# Patient Record
Sex: Female | Born: 1940 | Race: Black or African American | Hispanic: No | Marital: Married | Smoking: Former smoker
Health system: Southern US, Community
[De-identification: ages and names within clinical notes are randomized; demographics above are authoritative.]

## PROBLEM LIST (undated history)

## (undated) DIAGNOSIS — Z972 Presence of dental prosthetic device (complete) (partial): Secondary | ICD-10-CM

## (undated) DIAGNOSIS — G500 Trigeminal neuralgia: Secondary | ICD-10-CM

## (undated) DIAGNOSIS — F329 Major depressive disorder, single episode, unspecified: Secondary | ICD-10-CM

## (undated) DIAGNOSIS — F419 Anxiety disorder, unspecified: Secondary | ICD-10-CM

## (undated) DIAGNOSIS — K219 Gastro-esophageal reflux disease without esophagitis: Secondary | ICD-10-CM

## (undated) DIAGNOSIS — R7303 Prediabetes: Secondary | ICD-10-CM

## (undated) DIAGNOSIS — E785 Hyperlipidemia, unspecified: Secondary | ICD-10-CM

## (undated) DIAGNOSIS — S065X9A Traumatic subdural hemorrhage with loss of consciousness of unspecified duration, initial encounter: Secondary | ICD-10-CM

## (undated) DIAGNOSIS — E041 Nontoxic single thyroid nodule: Secondary | ICD-10-CM

## (undated) DIAGNOSIS — M1990 Unspecified osteoarthritis, unspecified site: Secondary | ICD-10-CM

## (undated) DIAGNOSIS — F32A Depression, unspecified: Secondary | ICD-10-CM

## (undated) DIAGNOSIS — K7689 Other specified diseases of liver: Secondary | ICD-10-CM

## (undated) DIAGNOSIS — I10 Essential (primary) hypertension: Secondary | ICD-10-CM

## (undated) HISTORY — PX: CHOLECYSTECTOMY: SHX55

## (undated) HISTORY — PX: ABDOMINAL HYSTERECTOMY: SHX81

---

## 2002-04-08 DIAGNOSIS — S065X9A Traumatic subdural hemorrhage with loss of consciousness of unspecified duration, initial encounter: Secondary | ICD-10-CM

## 2002-04-08 DIAGNOSIS — S065XAA Traumatic subdural hemorrhage with loss of consciousness status unknown, initial encounter: Secondary | ICD-10-CM

## 2002-04-08 HISTORY — DX: Traumatic subdural hemorrhage with loss of consciousness status unknown, initial encounter: S06.5XAA

## 2002-04-08 HISTORY — DX: Traumatic subdural hemorrhage with loss of consciousness of unspecified duration, initial encounter: S06.5X9A

## 2017-01-09 ENCOUNTER — Observation Stay
Admission: EM | Admit: 2017-01-09 | Discharge: 2017-01-10 | Disposition: A | Discharge status: Home / Self Care | Payer: Medicare Other | Attending: Internal Medicine | Admitting: Internal Medicine

## 2017-01-09 ENCOUNTER — Emergency Department: Payer: Medicare Other

## 2017-01-09 ENCOUNTER — Encounter: Payer: Self-pay | Admitting: Intensive Care

## 2017-01-09 DIAGNOSIS — I10 Essential (primary) hypertension: Secondary | ICD-10-CM | POA: Diagnosis present

## 2017-01-09 DIAGNOSIS — E785 Hyperlipidemia, unspecified: Secondary | ICD-10-CM | POA: Insufficient documentation

## 2017-01-09 DIAGNOSIS — I951 Orthostatic hypotension: Secondary | ICD-10-CM | POA: Diagnosis present

## 2017-01-09 DIAGNOSIS — F419 Anxiety disorder, unspecified: Secondary | ICD-10-CM | POA: Insufficient documentation

## 2017-01-09 DIAGNOSIS — Z7982 Long term (current) use of aspirin: Secondary | ICD-10-CM | POA: Diagnosis not present

## 2017-01-09 DIAGNOSIS — R9389 Abnormal findings on diagnostic imaging of other specified body structures: Secondary | ICD-10-CM | POA: Diagnosis not present

## 2017-01-09 DIAGNOSIS — I959 Hypotension, unspecified: Secondary | ICD-10-CM | POA: Diagnosis not present

## 2017-01-09 DIAGNOSIS — Z87891 Personal history of nicotine dependence: Secondary | ICD-10-CM | POA: Insufficient documentation

## 2017-01-09 DIAGNOSIS — N289 Disorder of kidney and ureter, unspecified: Secondary | ICD-10-CM | POA: Diagnosis present

## 2017-01-09 DIAGNOSIS — R55 Syncope and collapse: Secondary | ICD-10-CM

## 2017-01-09 DIAGNOSIS — F329 Major depressive disorder, single episode, unspecified: Secondary | ICD-10-CM | POA: Insufficient documentation

## 2017-01-09 DIAGNOSIS — E860 Dehydration: Secondary | ICD-10-CM | POA: Insufficient documentation

## 2017-01-09 DIAGNOSIS — R932 Abnormal findings on diagnostic imaging of liver and biliary tract: Secondary | ICD-10-CM | POA: Diagnosis not present

## 2017-01-09 DIAGNOSIS — Z8249 Family history of ischemic heart disease and other diseases of the circulatory system: Secondary | ICD-10-CM | POA: Diagnosis not present

## 2017-01-09 DIAGNOSIS — G500 Trigeminal neuralgia: Secondary | ICD-10-CM | POA: Diagnosis present

## 2017-01-09 HISTORY — DX: Anxiety disorder, unspecified: F41.9

## 2017-01-09 HISTORY — DX: Essential (primary) hypertension: I10

## 2017-01-09 HISTORY — DX: Depression, unspecified: F32.A

## 2017-01-09 HISTORY — DX: Trigeminal neuralgia: G50.0

## 2017-01-09 HISTORY — DX: Hyperlipidemia, unspecified: E78.5

## 2017-01-09 HISTORY — DX: Major depressive disorder, single episode, unspecified: F32.9

## 2017-01-09 LAB — TROPONIN I

## 2017-01-09 LAB — URINALYSIS, COMPLETE (UACMP) WITH MICROSCOPIC
BACTERIA UA: NONE SEEN
Bilirubin Urine: NEGATIVE
Glucose, UA: NEGATIVE mg/dL
Hgb urine dipstick: NEGATIVE
Ketones, ur: NEGATIVE mg/dL
Leukocytes, UA: NEGATIVE
NITRITE: NEGATIVE
PROTEIN: NEGATIVE mg/dL
RBC / HPF: NONE SEEN RBC/hpf (ref 0–5)
SPECIFIC GRAVITY, URINE: 1.011 (ref 1.005–1.030)
pH: 6 (ref 5.0–8.0)

## 2017-01-09 LAB — CBC
HEMATOCRIT: 34 % — AB (ref 35.0–47.0)
HEMOGLOBIN: 11.4 g/dL — AB (ref 12.0–16.0)
MCH: 26.3 pg (ref 26.0–34.0)
MCHC: 33.5 g/dL (ref 32.0–36.0)
MCV: 78.5 fL — AB (ref 80.0–100.0)
Platelets: 260 10*3/uL (ref 150–440)
RBC: 4.33 MIL/uL (ref 3.80–5.20)
RDW: 14.8 % — AB (ref 11.5–14.5)
WBC: 6.7 10*3/uL (ref 3.6–11.0)

## 2017-01-09 LAB — BASIC METABOLIC PANEL
Anion gap: 9 (ref 5–15)
BUN: 15 mg/dL (ref 6–20)
CO2: 28 mmol/L (ref 22–32)
Calcium: 9.4 mg/dL (ref 8.9–10.3)
Chloride: 98 mmol/L — ABNORMAL LOW (ref 101–111)
Creatinine, Ser: 1.15 mg/dL — ABNORMAL HIGH (ref 0.44–1.00)
GFR calc Af Amer: 53 mL/min — ABNORMAL LOW (ref >60)
GFR, EST NON AFRICAN AMERICAN: 46 mL/min — AB (ref >60)
GLUCOSE: 149 mg/dL — AB (ref 65–99)
POTASSIUM: 4.4 mmol/L (ref 3.5–5.1)
Sodium: 135 mmol/L (ref 135–145)

## 2017-01-09 LAB — TSH: TSH: 0.98 u[IU]/mL (ref 0.350–4.500)

## 2017-01-09 LAB — FIBRIN DERIVATIVES D-DIMER (ARMC ONLY): FIBRIN DERIVATIVES D-DIMER (ARMC): 888.87 ng{FEU}/mL — AB (ref 0.00–499.00)

## 2017-01-09 LAB — GLUCOSE, CAPILLARY: GLUCOSE-CAPILLARY: 142 mg/dL — AB (ref 65–99)

## 2017-01-09 MED ORDER — PNEUMOCOCCAL VAC POLYVALENT 25 MCG/0.5ML IJ INJ: 0.5000 mL | INJECTION | INTRAMUSCULAR | Status: DC

## 2017-01-09 MED ORDER — ONDANSETRON HCL 4 MG/2ML IJ SOLN
4.0000 mg | Freq: Once | INTRAMUSCULAR | Status: AC
  Administered 2017-01-09: 4 mg via INTRAVENOUS
  Filled 2017-01-09: qty 2

## 2017-01-09 MED ORDER — SODIUM CHLORIDE 0.9 % IV BOLUS (SEPSIS)
1000.0000 mL | Freq: Once | INTRAVENOUS | Status: AC
  Administered 2017-01-09: 1000 mL via INTRAVENOUS

## 2017-01-09 MED ORDER — ATORVASTATIN CALCIUM 20 MG PO TABS
20.0000 mg | ORAL_TABLET | Freq: Every day | ORAL | Status: DC
  Administered 2017-01-10: 20 mg via ORAL
  Filled 2017-01-09: qty 1

## 2017-01-09 MED ORDER — CLONAZEPAM 0.5 MG PO TABS
0.2500 mg | ORAL_TABLET | Freq: Two times a day (BID) | ORAL | Status: DC
  Administered 2017-01-10 (×2): 0.25 mg via ORAL
  Filled 2017-01-09 (×2): qty 1

## 2017-01-09 MED ORDER — BIOTIN 5 MG PO TABS: 5.0000 mg | ORAL_TABLET | Freq: Two times a day (BID) | ORAL | Status: DC

## 2017-01-09 MED ORDER — INFLUENZA VAC SPLIT HIGH-DOSE 0.5 ML IM SUSY
0.5000 mL | PREFILLED_SYRINGE | INTRAMUSCULAR | Status: DC
  Filled 2017-01-09: qty 0.5

## 2017-01-09 MED ORDER — ASPIRIN EC 81 MG PO TBEC
162.0000 mg | DELAYED_RELEASE_TABLET | Freq: Every day | ORAL | Status: DC
  Administered 2017-01-10: 162 mg via ORAL
  Filled 2017-01-09: qty 2

## 2017-01-09 MED ORDER — GABAPENTIN 300 MG PO CAPS
600.0000 mg | ORAL_CAPSULE | Freq: Two times a day (BID) | ORAL | Status: DC
  Administered 2017-01-10 (×2): 600 mg via ORAL
  Filled 2017-01-09 (×2): qty 2

## 2017-01-09 MED ORDER — BUPROPION HCL ER (XL) 150 MG PO TB24
300.0000 mg | ORAL_TABLET | Freq: Every day | ORAL | Status: DC
  Administered 2017-01-10: 300 mg via ORAL
  Filled 2017-01-09: qty 2

## 2017-01-09 MED ORDER — ENOXAPARIN SODIUM 40 MG/0.4ML ~~LOC~~ SOLN: 40.0000 mg | SUBCUTANEOUS | Status: DC

## 2017-01-09 MED ORDER — ACETAMINOPHEN 650 MG RE SUPP: 650.0000 mg | Freq: Four times a day (QID) | RECTAL | Status: DC | PRN

## 2017-01-09 MED ORDER — ACETAMINOPHEN 325 MG PO TABS: 650.0000 mg | ORAL_TABLET | Freq: Four times a day (QID) | ORAL | Status: DC | PRN

## 2017-01-09 MED ORDER — IOPAMIDOL (ISOVUE-370) INJECTION 76%
75.0000 mL | Freq: Once | INTRAVENOUS | Status: AC | PRN
  Administered 2017-01-09: 75 mL via INTRAVENOUS

## 2017-01-09 MED ORDER — VENLAFAXINE HCL ER 75 MG PO CP24
150.0000 mg | ORAL_CAPSULE | Freq: Every day | ORAL | Status: DC
  Administered 2017-01-10: 150 mg via ORAL
  Filled 2017-01-09: qty 2

## 2017-01-09 MED ORDER — ONDANSETRON HCL 4 MG PO TABS: 4.0000 mg | ORAL_TABLET | Freq: Four times a day (QID) | ORAL | Status: DC | PRN

## 2017-01-09 MED ORDER — PANTOPRAZOLE SODIUM 40 MG PO TBEC
40.0000 mg | DELAYED_RELEASE_TABLET | Freq: Every day | ORAL | Status: DC
  Administered 2017-01-10: 40 mg via ORAL
  Filled 2017-01-09: qty 1

## 2017-01-09 MED ORDER — CARBAMAZEPINE ER 200 MG PO TB12
200.0000 mg | ORAL_TABLET | Freq: Two times a day (BID) | ORAL | Status: DC
  Administered 2017-01-10: 200 mg via ORAL
  Filled 2017-01-09 (×3): qty 1

## 2017-01-09 MED ORDER — ONDANSETRON HCL 4 MG/2ML IJ SOLN: 4.0000 mg | Freq: Four times a day (QID) | INTRAMUSCULAR | Status: DC | PRN

## 2017-01-09 NOTE — Progress Notes (Signed)
PHARMACIST - PHYSICIAN ORDER COMMUNICATION  CONCERNING: P&T Medication Policy on Herbal Medications  DESCRIPTION:  This patient's order for:  biotin  has been noted.  This product(s) is classified as an "herbal" or natural product. Due to a lack of definitive safety studies or FDA approval, nonstandard manufacturing practices, plus the potential risk of unknown drug-drug interactions while on inpatient medications, the Pharmacy and Therapeutics Committee does not permit the use of "herbal" or natural products of this type within [place].   ACTION TAKEN: The pharmacy department is unable to verify this order at this time Please reevaluate patient's clinical condition at discharge and address if the herbal or natural product(s) should be resumed at that time.   

## 2017-01-09 NOTE — ED Provider Notes (Signed)
[facility] Emergency Department Provider Note  ____________________________________________  Time seen: Approximately 5:20 PM  I have reviewed the triage vital signs and the nursing notes.   HISTORY  Chief Complaint Loss of Consciousness    HPI Lynnett Moffitt is a 76 y.o. female with a history of hypertension, hyperlipidemia, presenting with syncope 4. The patient reports that she had 3 "blacking out spells" yesterday.She describes that she develops a "funny feeling" in the frontal part of her head, then had severe lightheadedness with nausea, and had brief syncopal episodes w/o any injury, tonic clonic activity, urinary incontinence.  She states she has had a decreased appetite for weeks, but has been drinking normally.  Today, she was at neurology clinic for routine f/u, and was at the desk when she had a similar episode.  She denies any associated chest pain, shortness of breath, palpitations, or extremity swelling or calf pain. She has not had any recent changes in her medications or illness including cough or cold symptoms, nausea vomiting or diarrhea. No urinary symptoms.   Past Medical History:  Diagnosis Date  . Anxiety   . Depression   . Hyperlipidemia   . Hypertension   . Trigeminal neuralgia     There are no active problems to display for this patient.   Past Surgical History:  Procedure Laterality Date  . CHOLECYSTECTOMY        Allergies Patient has no known allergies.  History reviewed. No pertinent family history.  Social History Social History  Substance Use Topics  . Smoking status: Former Smoker    Types: Cigarettes    Quit date: 68  . Smokeless tobacco: Never Used  . Alcohol use 1.2 oz/week    2 Glasses of wine per week     Comment: occ    Review of Systems Constitutional: No fever/chills.positive lightheadedness with syncope. No injury. Eyes: No visual changes.No blurred or double vision. ENT: No sore throat. No  congestion or rhinorrhea. Cardiovascular: Denies chest pain. Denies palpitations. Respiratory: Denies shortness of breath.  No cough. Gastrointestinal: No abdominal pain.  No nausea, no vomiting.  No diarrhea.  No constipation. Genitourinary: Negative for dysuria. Musculoskeletal: Negative for back pain. Skin: Negative for rash. Neurological: Negative for headaches; positive for "funny feeling" in the frontal part of her head.. No focal numbness, tingling or weakness. No changes in vision, speech or mental status. No difficulty walking.    ____________________________________________   PHYSICAL EXAM:  VITAL SIGNS: ED Triage Vitals  Enc Vitals Group     BP 01/09/17 1602 92/61     Pulse Rate 01/09/17 1602 75     Resp 01/09/17 1602 18     Temp 01/09/17 1602 98.2 F (36.8 C)     Temp Source 01/09/17 1602 Oral     SpO2 01/09/17 1602 96 %     Weight 01/09/17 1606 154 lb (69.9 kg)     Height 01/09/17 1606  (1.575 m)     Head Circumference --      Peak Flow --      Pain Score --      Pain Loc --      Pain Edu? --      Excl. in GC? --     Constitutional: Alert and oriented. Well appearing and in no acute distress. Answers questions appropriately. Eyes: Conjunctivae are normal.  EOMI. PERRLA. No horizontal or vertical nystagmus.No scleral icterus. Head: Atraumatic. Nose: No congestion/rhinnorhea. Mouth/Throat: Mucous membranes are moist.  Neck: No stridor.  Supple.  No JVD. Cardiovascular: Normal rate, regular rhythm. No murmurs, rubs or gallops. Please review the patient's orthostatic vitals. Respiratory: Normal respiratory effort.  No accessory muscle use or retractions. Lungs CTAB.  No wheezes, rales or ronchi. Gastrointestinal: Soft, nontender and nondistended.  No guarding or rebound.  No peritoneal signs. Musculoskeletal: No LE edema. No ttp in the calves or palpable cords.  Negative Homan's sign. Neurologic: Alert and oriented 3. Speech is clear. Naming and repetition  are intact. Face and smile symmetric. Tongue is midline. No pronator drift. 5 out of 5 grip, biceps, triceps, hip flexors, plantar flexion and dorsiflexion. Normal sensation to light touch in the bilateral upper and lower extremities, and face. Normal gait without ataxia. Skin:  Skin is warm, dry and intact. No rash noted. Psychiatric: Mood and affect are normal. Speech and behavior are normal.  Normal judgement  ____________________________________________   LABS (all labs ordered are listed, but only abnormal results are displayed)  Labs Reviewed  BASIC METABOLIC PANEL - Abnormal; Notable for the following:       Result Value   Chloride 98 (*)    Glucose, Bld 149 (*)    Creatinine, Ser 1.15 (*)    GFR calc non Af Amer 46 (*)    GFR calc Af Amer 53 (*)    All other components within normal limits  CBC - Abnormal; Notable for the following:    Hemoglobin 11.4 (*)    HCT 34.0 (*)    MCV 78.5 (*)    RDW 14.8 (*)    All other components within normal limits  GLUCOSE, CAPILLARY - Abnormal; Notable for the following:    Glucose-Capillary 142 (*)    All other components within normal limits  FIBRIN DERIVATIVES D-DIMER ([facility] ONLY) - Abnormal; Notable for the following:    Fibrin derivatives D-dimer ([facility]) 888.87 (*)    All other components within normal limits  TROPONIN I  TSH  URINALYSIS, COMPLETE (UACMP) WITH MICROSCOPIC  CBG MONITORING, ED   ____________________________________________  EKG  ED ECG REPORT I, Rockne Menghini, the attending physician, personally viewed and interpreted this ECG.   Date: 01/09/2017  EKG Time: 1607  Rate: 73  Rhythm: normal sinus rhythm; RBBB  Axis: leftward  Intervals:none  ST&T Change: No STEMI  There is no previous ECG for comparison.  ____________________________________________  RADIOLOGY  Ct Head Wo Contrast  Addendum Date: 01/09/2017   ADDENDUM REPORT: 01/09/2017 18:18 ADDENDUM: Right frontal and parietal burr holes  Electronically Signed   By: Jasmine Pang M.D.   On: 01/09/2017 18:18   Result Date: 01/09/2017 CLINICAL DATA:  Syncopal episode EXAM: CT HEAD WITHOUT CONTRAST TECHNIQUE: Contiguous axial images were obtained from the base of the skull through the vertex without intravenous contrast. COMPARISON:  None. FINDINGS: Brain: No acute territorial infarction, hemorrhage or intracranial mass is visualized. Mild to moderate atrophy. Normal ventricle size. Vascular: No hyperdense vessels.  Carotid artery calcification. Skull: Normal. Negative for fracture or focal lesion. Sinuses/Orbits: No acute finding. Other: None IMPRESSION: No CT evidence for acute intracranial abnormality. Electronically Signed: By: Jasmine Pang M.D. On: 01/09/2017 17:52    ____________________________________________   PROCEDURES  Procedure(s) performed: None  Procedures  Critical Care performed: No ____________________________________________   INITIAL IMPRESSION / ASSESSMENT AND PLAN / ED COURSE  Pertinent labs & imaging results that were available during my care of the patient were reviewed by me and considered in my medical decision making (see chart for details).  76 y.o. female with  a history of hypertension and hyperlipidemia presenting with syncope 4 over the last 24 hours. Overall, I am concerned about intermittent cardiac arrhythmia. We'll also evaluate the patient for hypovolemia; her mucous membranes are moist, she has been drinking normally, and there is no recent history of illness which would make her hypovolemic however. Medication related hypotension or orthostatic hypotension is possible. Plan laboratory studies, chest x-ray, CT of the head although I do not think she has any evidence for acute CVA today, and reevaluation for final disposition.  ----------------------------------------- 5:32 PM on 01/09/2017 -----------------------------------------  Patient is orthostatic on examination and has a blood  pressure which goes from 107/64-80 08/10/1950 with standing. Her laboratory studies do show a renal insufficiency and I have no priors for comparison. She has some mild anemia, but a hemoglobin of 11.4 is unlikely to be the cause of syncope 4. At this time, the patient is being treated w/ IVF and resting comfortably without any distress.  ----------------------------------------- 6:30 PM on 01/09/2017 -----------------------------------------  The patient does have a positive d-dimer, so we will follow this with a CT angiogram to rule out PE. At this time she continues to rest comfortably and understands the plan.  The pt's CT head does not show any acute intracranial process.  ____________________________________________  FINAL CLINICAL IMPRESSION(S) / ED DIAGNOSES  Final diagnoses:  Recurrent syncope  Renal insufficiency  Orthostasis         NEW MEDICATIONS STARTED DURING THIS VISIT:  New Prescriptions   No medications on file      Rockne Menghini, MD 01/09/17 1831

## 2017-01-09 NOTE — ED Notes (Signed)
Attempted to call report. Primary RN not available at this time. Was informed primary RN would call back when available.

## 2017-01-09 NOTE — ED Notes (Signed)
Called floor to let them know patient on the way 

## 2017-01-09 NOTE — ED Triage Notes (Signed)
Patient was being seen at [facility] for Trigeminal neuralgia when she had a syncopal episode witnessed by staff for 10-15 seconds. Staff reports they were able to lower her to the ground. Denies patient hitting her head. Denies blood thinners. A&O x4 during triage. PAtient states she felt very dizzy right before passing out with a "funny feeling" in her head with nausea

## 2017-01-09 NOTE — H&P (Signed)
[facility] [place] [facility]   PATIENT NAME: Allison Zhang    MR#:  [id number]  DATE OF BIRTH:  1940-05-16  DATE OF ADMISSION:  01/09/2017  PRIMARY CARE PHYSICIAN: Leotis Shames, MD   REQUESTING/REFERRING PHYSICIAN: Sharma Covert, MD  CHIEF COMPLAINT:   Chief Complaint  Patient presents with  . Loss of Consciousness    HISTORY OF PRESENT ILLNESS:  [name]  is a 76 y.o. female who presents with Several syncopal episodes. Patient describes that these episodes occur after she stands from a sitting position. She will get lightheaded, a little diaphoretic, and then if she is not able to sit down fast enough we'll pass out. She states that they're other times where she will feel lightheaded, but not actually pass out. She states that she's been having some lightheadedness when she stands up or gets up too fast for the past several months, but it's just been over the past 2 days that she's actually had syncopal episodes. When these episodes occurred while she was at her doctor's office today, and so she was sent to the ED for evaluation. Initial workup here was largely within normal limits, hospitalists were called for admission for further evaluation.  PAST MEDICAL HISTORY:   Past Medical History:  Diagnosis Date  . Anxiety   . Depression   . Hyperlipidemia   . Hypertension   . Trigeminal neuralgia     PAST SURGICAL HISTORY:   Past Surgical History:  Procedure Laterality Date  . CHOLECYSTECTOMY      SOCIAL HISTORY:   Social History  Substance Use Topics  . Smoking status: Former Smoker    Types: Cigarettes    Quit date: 67  . Smokeless tobacco: Never Used  . Alcohol use 1.2 oz/week    2 Glasses of wine per week     Comment: occ    FAMILY HISTORY:   Family History  Problem Relation Age of Onset  . Hypertension Mother   . Hypertension Father   . Hypertension Brother   . Clotting disorder Brother   . Cancer Brother     DRUG  ALLERGIES:  No Known Allergies  MEDICATIONS AT HOME:   Prior to Admission medications   Medication Sig Start Date End Date Taking? Authorizing Provider  aspirin EC 81 MG tablet Take 162 mg by mouth daily.   Yes [provider]  atorvastatin (LIPITOR) 20 MG tablet Take 20 mg by mouth at bedtime. 11/04/16  Yes [provider]  Biotin (SUPER BIOTIN) 5 MG TABS Take 5 mg by mouth 2 (two) times daily.   Yes [provider]  buPROPion (WELLBUTRIN XL) 300 MG 24 hr tablet Take 300 mg by mouth daily. 12/06/16  Yes [provider]  carbamazepine (TEGRETOL XR) 200 MG 12 hr tablet Take 200 mg by mouth 2 (two) times daily. 01/09/17  Yes [provider]  Cholecalciferol (VITAMIN D3) 2000 units capsule Take 2,000 Units by mouth daily.   Yes [provider]  clonazePAM (KLONOPIN) 0.5 MG tablet Take 0.25 mg by mouth 2 (two) times daily. 11/23/16  Yes [provider]  eszopiclone (LUNESTA) 2 MG TABS tablet Take 2 mg by mouth at bedtime. 11/19/16  Yes [provider]  folic acid (FOLVITE) 800 MCG tablet Take 400 mg by mouth daily.   Yes [provider]  gabapentin (NEURONTIN) 600 MG tablet Take 300 mg by mouth 2 (two) times daily.  11/04/16  Yes [provider]  losartan-hydrochlorothiazide Mauri Reading)  100-12.5 MG tablet Take 1 tablet by mouth daily. 11/22/16  Yes [provider]  omeprazole (PRILOSEC) 40 MG capsule Take 40 mg by mouth daily. 10/11/16  Yes [provider]  venlafaxine XR (EFFEXOR-XR) 150 MG 24 hr capsule Take 150 mg by mouth daily. 12/27/16  Yes [provider]    REVIEW OF SYSTEMS:  Review of Systems  Constitutional: Negative for chills, fever, malaise/fatigue and weight loss.  HENT: Negative for ear pain, hearing loss and tinnitus.   Eyes: Negative for blurred vision, double vision, pain and redness.  Respiratory: Negative for cough, hemoptysis and shortness of breath.   Cardiovascular:  Negative for chest pain, palpitations, orthopnea and leg swelling.  Gastrointestinal: Negative for abdominal pain, constipation, diarrhea, nausea and vomiting.  Genitourinary: Negative for dysuria, frequency and hematuria.  Musculoskeletal: Negative for back pain, joint pain and neck pain.  Skin:       No acne, rash, or lesions  Neurological: Positive for loss of consciousness. Negative for dizziness, tremors, focal weakness and weakness.  Endo/Heme/Allergies: Negative for polydipsia. Does not bruise/bleed easily.  Psychiatric/Behavioral: Negative for depression. The patient is not nervous/anxious and does not have insomnia.      VITAL SIGNS:   Vitals:   01/09/17 1606 01/09/17 1834 01/09/17 1930 01/09/17 2000  BP:  122/61 136/76 (!) 149/83  Pulse:  66 74 72  Resp:  Temp:      TempSrc:      SpO2:  100% 100% 100%  Weight: 69.9 kg (154 lb)     Height:  (1.575 m)      Wt Readings from Last 3 Encounters:  01/09/17 69.9 kg (154 lb)    PHYSICAL EXAMINATION:  Physical Exam  Vitals reviewed. Constitutional: She is oriented to person, place, and time. She appears well-developed and well-nourished. No distress.  HENT:  Head: Normocephalic and atraumatic.  Mouth/Throat: Oropharynx is clear and moist.  Eyes: Pupils are equal, round, and reactive to light. Conjunctivae and EOM are normal. No scleral icterus.  Neck: Normal range of motion. Neck supple. No JVD present. No thyromegaly present.  Cardiovascular: Normal rate, regular rhythm and intact distal pulses.  Exam reveals no gallop and no friction rub.   No murmur heard. Respiratory: Effort normal and breath sounds normal. No respiratory distress. She has no wheezes. She has no rales.  GI: Soft. Bowel sounds are normal. She exhibits no distension. There is no tenderness.  Musculoskeletal: Normal range of motion. She exhibits no edema.  No arthritis, no gout  Lymphadenopathy:    She has no cervical adenopathy.   Neurological: She is alert and oriented to person, place, and time. No cranial nerve deficit.  No dysarthria, no aphasia  Skin: Skin is warm and dry. No rash noted. No erythema.  Psychiatric: She has a normal mood and affect. Her behavior is normal. Judgment and thought content normal.    LABORATORY PANEL:   CBC  Recent Labs Lab 01/09/17 1624  WBC 6.7  HGB 11.4*  HCT 34.0*  PLT 260   ------------------------------------------------------------------------------------------------------------------  Chemistries   Recent Labs Lab 01/09/17 1624  NA 135  K 4.4  CL 98*  CO2 28  GLUCOSE 149*  BUN 15  CREATININE 1.15*  CALCIUM 9.4   ------------------------------------------------------------------------------------------------------------------  Cardiac Enzymes  Recent Labs Lab 01/09/17 1624  TROPONINI <0.03   ------------------------------------------------------------------------------------------------------------------  RADIOLOGY:  Ct Head Wo Contrast  Addendum Date: 01/09/2017   ADDENDUM REPORT: 01/09/2017 18:18 ADDENDUM: Right frontal and parietal burr  holes Electronically Signed   By: Jasmine Pang M.D.   On: 01/09/2017 18:18   Result Date: 01/09/2017 CLINICAL DATA:  Syncopal episode EXAM: CT HEAD WITHOUT CONTRAST TECHNIQUE: Contiguous axial images were obtained from the base of the skull through the vertex without intravenous contrast. COMPARISON:  None. FINDINGS: Brain: No acute territorial infarction, hemorrhage or intracranial mass is visualized. Mild to moderate atrophy. Normal ventricle size. Vascular: No hyperdense vessels.  Carotid artery calcification. Skull: Normal. Negative for fracture or focal lesion. Sinuses/Orbits: No acute finding. Other: None IMPRESSION: No CT evidence for acute intracranial abnormality. Electronically Signed: By: Jasmine Pang M.D. On: 01/09/2017 17:52   Ct Angio Chest Pe W And/or Wo Contrast  Result Date: 01/09/2017 CLINICAL  DATA:  Syncope x4. History of hypertension, hyperlipidemia. EXAM: CT ANGIOGRAPHY CHEST WITH CONTRAST TECHNIQUE: Multidetector CT imaging of the chest was performed using the standard protocol during bolus administration of intravenous contrast. Multiplanar CT image reconstructions and MIPs were obtained to evaluate the vascular anatomy. CONTRAST:  75 cc Isovue 370 COMPARISON:  None FINDINGS: Cardiovascular: Pulmonary arteries are well opacified. There is no acute pulmonary embolus. Heart size is normal. No significant coronary artery calcifications. No pericardial effusion. No significant atherosclerotic calcification of the thoracic aorta. No aneurysm. Mediastinum/Nodes: Asymmetry of the thyroid gland, left lobe larger than the right globe. Suspect a 2.2 cm low-attenuation lesion within the left lobe. Lungs/Pleura: Bilateral dependent atelectasis. No focal consolidations. No pleural effusions. Upper Abdomen: There are numerous low-attenuation lesions throughout the liver, not fully characterized given the phase of contrast. Many of these are likely cysts given the appearance of circumscribed margins. However multiple lesions have poorly defined margins and remain indeterminate. Patient has had prior cholecystectomy. A 1.5 cm left adrenal nodule is not further characterized. Musculoskeletal: There are moderate degenerative changes in the midthoracic spine. No suspicious lytic or blastic lesions. Review of the MIP images confirms the above findings. IMPRESSION: 1. Technically adequate exam showing no acute pulmonary embolus. 2. Suspect 2.2 cm left thyroid nodule. Recommend further evaluation with thyroid ultrasound per consensus criteria. 3. Numerous low-attenuation lesions throughout the liver, many of which are likely benign. However some of the lesions appear somewhat indistinct and further characterization is warranted. Recommend follow-up MRI. MRI should be performed when the patient is clinically stable and  able to follow breath holding instructions (usually best performed on an outpatient basis). 4. Previous cholecystectomy. 5. Left adrenal nodule is 1.5 cm. Nodule can be evaluated at the time of follow-up MRI. Electronically Signed   By: Norva Pavlov M.D.   On: 01/09/2017 19:29    EKG:   Orders placed or performed during the hospital encounter of 01/09/17  . EKG 12-Lead  . EKG 12-Lead  . ED EKG  . ED EKG    IMPRESSION AND PLAN:  Principal Problem:   Syncope - strong suspicion for orthostatic hypotension. However, we will admit her, trend her cardiac enzymes, get an echocardiogram and cardiology consult. Active Problems:   HTN (hypertension) - continue home meds   HLD (hyperlipidemia) - continue home medications   Trigeminal neuralgia - home meds  All the records are reviewed and case discussed with ED provider. Management plans discussed with the patient and/or family.  DVT PROPHYLAXIS: SubQ lovenox  GI PROPHYLAXIS: None  ADMISSION STATUS: Observation  CODE STATUS: Full Code Status History    This patient does not have a recorded code status. Please follow your organizational policy for patients in this situation.    Advance Directive  Documentation     Most Recent Value  Type of Advance Directive  Healthcare Power of Attorney, Living will  Pre-existing out of facility DNR order (yellow form or pink MOST form)  -  "MOST" Form in Place?  -      TOTAL TIME TAKING CARE OF THIS PATIENT: 40 minutes.   Naomie Crow FIELDING 01/09/2017, 8:57 PM  [facility]  Office  [phone]  CC: Primary care physician; Leotis Shames, MD  Note:  This document was prepared using Dragon voice recognition software and may include unintentional dictation errors.

## 2017-01-10 ENCOUNTER — Observation Stay
Admit: 2017-01-10 | Discharge: 2017-01-10 | Disposition: A | Discharge status: Home / Self Care | Payer: Medicare Other | Attending: Internal Medicine | Admitting: Internal Medicine

## 2017-01-10 DIAGNOSIS — I959 Hypotension, unspecified: Secondary | ICD-10-CM | POA: Diagnosis not present

## 2017-01-10 LAB — CBC
HCT: 32.7 % — ABNORMAL LOW (ref 35.0–47.0)
Hemoglobin: 10.8 g/dL — ABNORMAL LOW (ref 12.0–16.0)
MCH: 25.5 pg — AB (ref 26.0–34.0)
MCHC: 33.1 g/dL (ref 32.0–36.0)
MCV: 77 fL — ABNORMAL LOW (ref 80.0–100.0)
PLATELETS: 268 10*3/uL (ref 150–440)
RBC: 4.25 MIL/uL (ref 3.80–5.20)
RDW: 14.8 % — ABNORMAL HIGH (ref 11.5–14.5)
WBC: 7.4 10*3/uL (ref 3.6–11.0)

## 2017-01-10 LAB — BASIC METABOLIC PANEL
Anion gap: 8 (ref 5–15)
BUN: 13 mg/dL (ref 6–20)
CALCIUM: 8.6 mg/dL — AB (ref 8.9–10.3)
CO2: 26 mmol/L (ref 22–32)
CREATININE: 0.78 mg/dL (ref 0.44–1.00)
Chloride: 104 mmol/L (ref 101–111)
Glucose, Bld: 98 mg/dL (ref 65–99)
Potassium: 3.7 mmol/L (ref 3.5–5.1)
SODIUM: 138 mmol/L (ref 135–145)

## 2017-01-10 LAB — TROPONIN I: Troponin I: <0.03 ng/mL (ref <0.03)

## 2017-01-10 LAB — ECHOCARDIOGRAM COMPLETE
HEIGHTINCHES: 62 in
Weight: 2510.4 oz

## 2017-01-10 MED ORDER — ZOLPIDEM TARTRATE 5 MG PO TABS
5.0000 mg | ORAL_TABLET | Freq: Once | ORAL | Status: AC
Start: 2017-01-10 — End: 2017-01-10
  Administered 2017-01-10: 5 mg via ORAL
  Filled 2017-01-10: qty 1

## 2017-01-10 NOTE — Consult Note (Signed)
[facility] CLINIC CARDIOLOGY A [facility] PR[facility]  CARDIOLOGY CONSULT NOTE  Patient ID: Allison Zhang MRN: [id number] DOB/AGE: 1941/02/08 76 y.o.  Admit date: 01/09/2017 Referring Physician Dr. Allena Katz Primary Physician Dr. Thedore Mins Primary Cardiologist  Previously see by cardiology at [facility]. Jomarie Longs in [place] Reason for Consultation dizzyness and syncope  HPI: Pt has had no prior cardiac problems.S She has recently note dizzyness and near or complete syncope when standing up. No excessive diuresis, nausea, vomiting or diahhrea. No new meds. No chest pain. Ruled out for mi. No sensation for dysrhythmia. EKG normal.   Review of Systems  Constitutional: Negative.   HENT: Negative.   Eyes: Negative.   Respiratory: Negative.   Cardiovascular: Negative.   Gastrointestinal: Negative.   Genitourinary: Negative.   Musculoskeletal: Negative.   Skin: Negative.   Neurological: Positive for dizziness and loss of consciousness.  Endo/Heme/Allergies: Negative.   Psychiatric/Behavioral: Negative.     Past Medical History:  Diagnosis Date  . Anxiety   . Depression   . Hyperlipidemia   . Hypertension   . Trigeminal neuralgia     Family History  Problem Relation Age of Onset  . Hypertension Mother   . Hypertension Father   . Hypertension Brother   . Clotting disorder Brother   . Cancer Brother     Social History   Social History  . Marital status: Married    Spouse name: N/A  . Number of children: N/A  . Years of education: N/A   Occupational History  . Not on file.   Social History Main Topics  . Smoking status: Former Smoker    Types: Cigarettes    Quit date: 14  . Smokeless tobacco: Never Used  . Alcohol use 1.2 oz/week    2 Glasses of wine per week     Comment: occ  . Drug use: No  . Sexual activity: Not on file   Other Topics Concern  . Not on file   Social History Narrative  . No narrative on file    Past Surgical History:  Procedure  Laterality Date  . CHOLECYSTECTOMY       Prescriptions Prior to Admission  Medication Sig Dispense Refill Last Dose  . aspirin EC 81 MG tablet Take 162 mg by mouth daily.   01/09/2017 at 0800  . atorvastatin (LIPITOR) 20 MG tablet Take 20 mg by mouth at bedtime.   01/08/2017 at 2000  . Biotin (SUPER BIOTIN) 5 MG TABS Take 5 mg by mouth 2 (two) times daily.   01/09/2017 at 0800  . buPROPion (WELLBUTRIN XL) 300 MG 24 hr tablet Take 300 mg by mouth daily.   01/09/2017 at 0800  . carbamazepine (TEGRETOL XR) 200 MG 12 hr tablet Take 200 mg by mouth 2 (two) times daily.   01/09/2017 at 0800  . Cholecalciferol (VITAMIN D3) 2000 units capsule Take 2,000 Units by mouth daily.   01/09/2017 at Unknown time  . clonazePAM (KLONOPIN) 0.5 MG tablet Take 0.25 mg by mouth 2 (two) times daily.   01/09/2017 at 0800  . eszopiclone (LUNESTA) 2 MG TABS tablet Take 2 mg by mouth at bedtime.   01/08/2017 at 2000  . folic acid (FOLVITE) 800 MCG tablet Take 400 mg by mouth daily.   01/09/2017 at Unknown time  . gabapentin (NEURONTIN) 600 MG tablet Take 300 mg by mouth 2 (two) times daily.    01/09/2017 at 0800  . losartan-hydrochlorothiazide (HYZAAR) 100-12.5 MG tablet Take 1 tablet by mouth daily.  01/09/2017 at 0800  . omeprazole (PRILOSEC) 40 MG capsule Take 40 mg by mouth daily.   01/09/2017 at Unknown time  . venlafaxine XR (EFFEXOR-XR) 150 MG 24 hr capsule Take 150 mg by mouth daily.   01/09/2017 at 0800    Physical Exam: Blood pressure 121/63, pulse 76, temperature 98.5 F (36.9 C), temperature source Oral, resp. rate 14, height  (1.575 m), weight 71.2 kg (156 lb 14.4 oz), SpO2 100 %.   Wt Readings from Last 1 Encounters:  01/09/17 71.2 kg (156 lb 14.4 oz)     General appearance: alert and cooperative Head: Normocephalic, without obvious abnormality, atraumatic Resp: clear to auscultation bilaterally Cardio: regular rate and rhythm GI: soft, non-tender; bowel sounds normal; no masses,  no  organomegaly Extremities: extremities normal, atraumatic, no cyanosis or edema Neurologic: Grossly normal  Labs:   Lab Results  Component Value Date   WBC 7.4 01/10/2017   HGB 10.8 (L) 01/10/2017   HCT 32.7 (L) 01/10/2017   MCV 77.0 (L) 01/10/2017   PLT 268 01/10/2017    Recent Labs Lab 01/10/17 0553  NA 138  K 3.7  CL 104  CO2 26  BUN 13  CREATININE 0.78  CALCIUM 8.6*  GLUCOSE 98   Lab Results  Component Value Date   TROPONINI <0.03 01/10/2017      Radiology: CT of head showed no acute intracranial abnormality. No pulmonary embolus on chest ct.  EKG: nsr with no dysrhythmia..   ASSESSMENT AND PLAN:  76 yo female with history of several days of dizziness and occasional syncope when going from a seated to standing position. She denis chest pain. Echo showed normal lv function with no valvular abnormalities. No dysrhythmia on tele. She denis escessive nausea or vomiting but did get somewhat nauseated when stading up and getting dizzy. Has ruled out for an mi. Would remain off of any diuretics or anihypertensives at present. Would ambulate and consider discharge today with holter in place.  Signed: Dalia Heading MD, [facility] 01/10/2017, 2:18 PM

## 2017-01-10 NOTE — Discharge Instructions (Signed)
[facility] [place] at [facility]  DIET:  Cardiac diet  DISCHARGE CONDITION:  Stable  ACTIVITY:  Activity as tolerated  OXYGEN:  Home Oxygen: No.   Oxygen Delivery: room air  DISCHARGE LOCATION:  home    ADDITIONAL DISCHARGE INSTRUCTION: please keep log of bp measurements to take to primary md  If you experience worsening of your admission symptoms, develop shortness of breath, life threatening emergency, suicidal or homicidal thoughts you must seek medical attention immediately by calling 911 or calling your MD immediately  if symptoms less severe.  You Must read complete instructions/literature along with all the possible adverse reactions/side effects for all the Medicines you take and that have been prescribed to you. Take any new Medicines after you have completely understood and accpet all the possible adverse reactions/side effects.   Please note  You were cared for by a hospitalist during your hospital stay. If you have any questions about your discharge medications or the care you received while you were in the hospital after you are discharged, you can call the unit and asked to speak with the hospitalist on call if the hospitalist that took care of you is not available. Once you are discharged, your primary care physician will handle any further medical issues. Please note that NO REFILLS for any discharge medications will be authorized once you are discharged, as it is imperative that you return to your primary care physician (or establish a relationship with a primary care physician if you do not have one) for your aftercare needs so that they can reassess your need for medications and monitor your lab values.

## 2017-01-10 NOTE — Progress Notes (Signed)
*  PRELIMINARY RESULTS* Echocardiogram 2D Echocardiogram has been performed.  Cristela Blue 01/10/2017, 12:21 PM

## 2017-01-10 NOTE — Care Management Obs Status (Signed)
MEDICARE OBSERVATION STATUS NOTIFICATION   Patient Details  Name: Allison Zhang MRN: [id number] Date of Birth: 1940-08-26   Medicare Observation Status Notification Given:  Yes Notice signed, one given to patient and the other to HIM for scanning    Eber Hong, RN 01/10/2017, 11:52 AM

## 2017-01-10 NOTE — Discharge Summary (Addendum)
[facility] - [place] at [facility], 76 y.o., DOB 11-22-1940, MRN [id number]. Admission date: 01/09/2017 Discharge Date 01/10/2017 Primary MD Leotis Shames, MD Admitting Physician Oralia Manis, MD  Admission Diagnosis  Orthostasis [I95.1] Renal insufficiency [N28.9] Recurrent syncope [R55]  Discharge Diagnosis   Principal Problem:   Syncope due to low blood pressure and dehydration, was seen by cardiology today may  arrange Holter monitor   HTN (hypertension)   HLD (hyperlipidemia)   Trigeminal neuralgia   Depression   Anxiety  Abnormal CT scan of the chest of the liver needs outpatient follow-up with primary care provider and  need a MRI of the abdomen  as suggested by the CT scan        Hospital Course  Allison Zhang  is a 76 y.o. female who presents with Several syncopal episodes. Patient describes that these episodes occur after she stands from a sitting position. She will get lightheaded, a little diaphoretic, and then if she is not able to sit down fast enough we'll pass out. Patient when she arrived was noted to have low blood pressure. She is on antihypertensives she will receive IV fluids her blood pressure is now normal. I have recommended that she hold her blood pressure medications for now until seen by her primary care provider. She will need to keep a log to take to her primary care provider.             Consults  None  Significant Tests:  See full reports for all details    Ct Head Wo Contrast  Addendum Date: 01/09/2017   ADDENDUM REPORT: 01/09/2017 18:18 ADDENDUM: Right frontal and parietal burr holes Electronically Signed   By: Jasmine Pang M.D.   On: 01/09/2017 18:18   Result Date: 01/09/2017 CLINICAL DATA:  Syncopal episode EXAM: CT HEAD WITHOUT CONTRAST TECHNIQUE: Contiguous axial images were obtained from the base of the skull through the vertex without intravenous contrast. COMPARISON:  None. FINDINGS: Brain: No  acute territorial infarction, hemorrhage or intracranial mass is visualized. Mild to moderate atrophy. Normal ventricle size. Vascular: No hyperdense vessels.  Carotid artery calcification. Skull: Normal. Negative for fracture or focal lesion. Sinuses/Orbits: No acute finding. Other: None IMPRESSION: No CT evidence for acute intracranial abnormality. Electronically Signed: By: Jasmine Pang M.D. On: 01/09/2017 17:52   Ct Angio Chest Pe W And/or Wo Contrast  Result Date: 01/09/2017 CLINICAL DATA:  Syncope x4. History of hypertension, hyperlipidemia. EXAM: CT ANGIOGRAPHY CHEST WITH CONTRAST TECHNIQUE: Multidetector CT imaging of the chest was performed using the standard protocol during bolus administration of intravenous contrast. Multiplanar CT image reconstructions and MIPs were obtained to evaluate the vascular anatomy. CONTRAST:  75 cc Isovue 370 COMPARISON:  None FINDINGS: Cardiovascular: Pulmonary arteries are well opacified. There is no acute pulmonary embolus. Heart size is normal. No significant coronary artery calcifications. No pericardial effusion. No significant atherosclerotic calcification of the thoracic aorta. No aneurysm. Mediastinum/Nodes: Asymmetry of the thyroid gland, left lobe larger than the right globe. Suspect a 2.2 cm low-attenuation lesion within the left lobe. Lungs/Pleura: Bilateral dependent atelectasis. No focal consolidations. No pleural effusions. Upper Abdomen: There are numerous low-attenuation lesions throughout the liver, not fully characterized given the phase of contrast. Many of these are likely cysts given the appearance of circumscribed margins. However multiple lesions have poorly defined margins and remain indeterminate. Patient has had prior cholecystectomy. A 1.5 cm left adrenal nodule is not further characterized. Musculoskeletal: There are moderate degenerative changes in the  midthoracic spine. No suspicious lytic or blastic lesions. Review of the MIP images  confirms the above findings. IMPRESSION: 1. Technically adequate exam showing no acute pulmonary embolus. 2. Suspect 2.2 cm left thyroid nodule. Recommend further evaluation with thyroid ultrasound per consensus criteria. 3. Numerous low-attenuation lesions throughout the liver, many of which are likely benign. However some of the lesions appear somewhat indistinct and further characterization is warranted. Recommend follow-up MRI. MRI should be performed when the patient is clinically stable and able to follow breath holding instructions (usually best performed on an outpatient basis). 4. Previous cholecystectomy. 5. Left adrenal nodule is 1.5 cm. Nodule can be evaluated at the time of follow-up MRI. Electronically Signed   By: Norva Pavlov M.D.   On: 01/09/2017 19:29       Today   Subjective:   Allison Zhang Patient currently denies any symptoms feeling better Blood pressure 121/63, pulse 76, temperature 98.5 F (36.9 C), temperature source Oral, resp. rate 14, height  (1.575 m), weight 156 lb 14.4 oz (71.2 kg), SpO2 100 %.  .  Intake/Output Summary (Last 24 hours) at 01/10/17 1336 Last data filed at 01/10/17 0100  Gross per 24 hour  Intake                0 ml  Output                0 ml  Net                0 ml    Exam VITAL SIGNS: Blood pressure 121/63, pulse 76, temperature 98.5 F (36.9 C), temperature source Oral, resp. rate 14, height  (1.575 m), weight 156 lb 14.4 oz (71.2 kg), SpO2 100 %.  GENERAL:  76 y.o.-year-old patient lying in the bed with no acute distress.  EYES: Pupils equal, round, reactive to light and accommodation. No scleral icterus. Extraocular muscles intact.  HEENT: Head atraumatic, normocephalic. Oropharynx and nasopharynx clear.  NECK:  Supple, no jugular venous distention. No thyroid enlargement, no tenderness.  LUNGS: Normal breath sounds bilaterally, no wheezing, rales,rhonchi or crepitation. No use of accessory muscles of respiration.   CARDIOVASCULAR: S1, S2 normal. No murmurs, rubs, or gallops.  ABDOMEN: Soft, nontender, nondistended. Bowel sounds present. No organomegaly or mass.  EXTREMITIES: No pedal edema, cyanosis, or clubbing.  NEUROLOGIC: Cranial nerves II through XII are intact. Muscle strength 5/5 in all extremities. Sensation intact. Gait not checked.  PSYCHIATRIC: The patient is alert and oriented x 3.  SKIN: No obvious rash, lesion, or ulcer.   Data Review     CBC w Diff: Lab Results  Component Value Date   WBC 7.4 01/10/2017   HGB 10.8 (L) 01/10/2017   HCT 32.7 (L) 01/10/2017   PLT 268 01/10/2017   CMP: Lab Results  Component Value Date   NA 138 01/10/2017   K 3.7 01/10/2017   CL 104 01/10/2017   CO2 26 01/10/2017   BUN 13 01/10/2017   CREATININE 0.78 01/10/2017  .  Micro Results No results found for this or any previous visit (from the past 240 hour(s)).      Code Status Orders        Start     Ordered   01/09/17 2309  Full code  Continuous     01/09/17 2308    Code Status History    Date Active Date Inactive Code Status Order ID Comments User Context   This patient has a current code status  but no historical code status.    Advance Directive Documentation     Most Recent Value  Type of Advance Directive  Healthcare Power of Attorney, Living will  Pre-existing out of facility DNR order (yellow form or pink MOST form)  -  "MOST" Form in Place?  -          Follow-up Information    Leotis Shames, MD Follow up on 01/20/2017.   Specialty:  Internal Medicine Why:  hospital f/u, Appointment Time: 9:45am Contact information: [address] [facility] [place] [address] [phone]           Discharge Medications   Allergies as of 01/10/2017   No Known Allergies     Medication List    STOP taking these medications   losartan-hydrochlorothiazide 100-12.5 MG tablet Commonly known as:  HYZAAR     TAKE these medications   aspirin EC 81 MG  tablet Take 162 mg by mouth daily.   atorvastatin 20 MG tablet Commonly known as:  LIPITOR Take 20 mg by mouth at bedtime.   buPROPion 300 MG 24 hr tablet Commonly known as:  WELLBUTRIN XL Take 300 mg by mouth daily.   carbamazepine 200 MG 12 hr tablet Commonly known as:  TEGRETOL XR Take 200 mg by mouth 2 (two) times daily.   clonazePAM 0.5 MG tablet Commonly known as:  KLONOPIN Take 0.25 mg by mouth 2 (two) times daily.   eszopiclone 2 MG Tabs tablet Commonly known as:  LUNESTA Take 2 mg by mouth at bedtime.   folic acid 800 MCG tablet Commonly known as:  FOLVITE Take 400 mg by mouth daily.   gabapentin 600 MG tablet Commonly known as:  NEURONTIN Take 300 mg by mouth 2 (two) times daily.   omeprazole 40 MG capsule Commonly known as:  PRILOSEC Take 40 mg by mouth daily.   SUPER BIOTIN 5 MG Tabs Generic drug:  Biotin Take 5 mg by mouth 2 (two) times daily.   venlafaxine XR 150 MG 24 hr capsule Commonly known as:  EFFEXOR-XR Take 150 mg by mouth daily.   Vitamin D3 2000 units capsule Take 2,000 Units by mouth daily.          Total Time in preparing paper work, data evaluation and todays exam - 35 minutes  Auburn Bilberry M.D on 01/10/2017 at 1:36 PM  [facility]   Office  [phone]

## 2017-02-12 ENCOUNTER — Ambulatory Visit: Payer: Self-pay | Admitting: Psychiatry

## 2017-04-28 ENCOUNTER — Other Ambulatory Visit: Payer: Self-pay | Admitting: "Endocrinology

## 2017-04-28 DIAGNOSIS — E279 Disorder of adrenal gland, unspecified: Principal | ICD-10-CM

## 2017-04-28 DIAGNOSIS — R160 Hepatomegaly, not elsewhere classified: Secondary | ICD-10-CM

## 2017-04-28 DIAGNOSIS — E278 Other specified disorders of adrenal gland: Secondary | ICD-10-CM

## 2017-05-08 ENCOUNTER — Ambulatory Visit
Admission: RE | Admit: 2017-05-08 | Discharge: 2017-05-08 | Disposition: A | Discharge status: Home / Self Care | Payer: Medicare Other | Source: Ambulatory Visit | Attending: "Endocrinology | Admitting: "Endocrinology

## 2017-05-08 DIAGNOSIS — E279 Disorder of adrenal gland, unspecified: Secondary | ICD-10-CM | POA: Diagnosis present

## 2017-05-08 DIAGNOSIS — N281 Cyst of kidney, acquired: Secondary | ICD-10-CM | POA: Insufficient documentation

## 2017-05-08 DIAGNOSIS — R160 Hepatomegaly, not elsewhere classified: Secondary | ICD-10-CM | POA: Insufficient documentation

## 2017-05-08 DIAGNOSIS — E278 Other specified disorders of adrenal gland: Secondary | ICD-10-CM

## 2017-05-08 DIAGNOSIS — I700 Atherosclerosis of aorta: Secondary | ICD-10-CM | POA: Insufficient documentation

## 2017-05-08 DIAGNOSIS — K7689 Other specified diseases of liver: Secondary | ICD-10-CM | POA: Diagnosis not present

## 2017-05-08 LAB — POCT I-STAT CREATININE: Creatinine, Ser: 0.9 mg/dL (ref 0.44–1.00)

## 2017-05-08 MED ORDER — GADOBENATE DIMEGLUMINE 529 MG/ML IV SOLN
15.0000 mL | Freq: Once | INTRAVENOUS | Status: AC | PRN
  Administered 2017-05-08: 14 mL via INTRAVENOUS

## 2017-05-21 ENCOUNTER — Other Ambulatory Visit: Payer: Self-pay | Admitting: "Endocrinology

## 2017-05-21 DIAGNOSIS — E279 Disorder of adrenal gland, unspecified: Principal | ICD-10-CM

## 2017-05-21 DIAGNOSIS — E278 Other specified disorders of adrenal gland: Secondary | ICD-10-CM

## 2017-08-13 ENCOUNTER — Other Ambulatory Visit: Payer: Self-pay | Admitting: Internal Medicine

## 2017-08-13 DIAGNOSIS — Z1231 Encounter for screening mammogram for malignant neoplasm of breast: Secondary | ICD-10-CM

## 2017-08-29 ENCOUNTER — Ambulatory Visit
Admission: RE | Admit: 2017-08-29 | Discharge: 2017-08-29 | Disposition: A | Discharge status: Home / Self Care | Payer: Medicare Other | Source: Ambulatory Visit | Attending: "Endocrinology | Admitting: "Endocrinology

## 2017-08-29 DIAGNOSIS — K7689 Other specified diseases of liver: Secondary | ICD-10-CM | POA: Insufficient documentation

## 2017-08-29 DIAGNOSIS — I700 Atherosclerosis of aorta: Secondary | ICD-10-CM | POA: Diagnosis not present

## 2017-08-29 DIAGNOSIS — N281 Cyst of kidney, acquired: Secondary | ICD-10-CM | POA: Insufficient documentation

## 2017-08-29 DIAGNOSIS — E278 Other specified disorders of adrenal gland: Secondary | ICD-10-CM | POA: Insufficient documentation

## 2017-08-29 DIAGNOSIS — E279 Disorder of adrenal gland, unspecified: Secondary | ICD-10-CM

## 2017-08-29 DIAGNOSIS — K760 Fatty (change of) liver, not elsewhere classified: Secondary | ICD-10-CM | POA: Diagnosis not present

## 2017-08-29 MED ORDER — GADOBENATE DIMEGLUMINE 529 MG/ML IV SOLN
14.0000 mL | Freq: Once | INTRAVENOUS | Status: AC | PRN
  Administered 2017-08-29: 14 mL via INTRAVENOUS

## 2017-09-01 ENCOUNTER — Ambulatory Visit: Payer: Medicare Other

## 2017-09-03 ENCOUNTER — Ambulatory Visit
Admission: RE | Admit: 2017-09-03 | Discharge: 2017-09-03 | Disposition: A | Discharge status: Home / Self Care | Payer: Medicare Other | Source: Ambulatory Visit | Attending: Internal Medicine | Admitting: Internal Medicine

## 2017-09-03 DIAGNOSIS — Z1231 Encounter for screening mammogram for malignant neoplasm of breast: Secondary | ICD-10-CM | POA: Insufficient documentation

## 2017-09-15 ENCOUNTER — Inpatient Hospital Stay
Admission: RE | Admit: 2017-09-15 | Discharge: 2017-09-15 | Disposition: A | Discharge status: Home / Self Care | Payer: Self-pay | Source: Ambulatory Visit | Attending: *Deleted | Admitting: *Deleted

## 2017-09-15 ENCOUNTER — Other Ambulatory Visit: Payer: Self-pay | Admitting: *Deleted

## 2017-09-15 DIAGNOSIS — Z9289 Personal history of other medical treatment: Secondary | ICD-10-CM

## 2018-01-20 ENCOUNTER — Other Ambulatory Visit: Payer: Self-pay

## 2018-01-20 ENCOUNTER — Encounter: Payer: Self-pay | Admitting: *Deleted

## 2018-01-28 ENCOUNTER — Ambulatory Visit: Payer: Medicare Other | Admitting: Anesthesiology

## 2018-01-28 ENCOUNTER — Encounter
Admission: RE | Disposition: A | Discharge status: Home / Self Care | Payer: Self-pay | Source: Ambulatory Visit | Attending: Surgery

## 2018-01-28 ENCOUNTER — Ambulatory Visit
Admission: RE | Admit: 2018-01-28 | Discharge: 2018-01-28 | Disposition: A | Discharge status: Home / Self Care | Payer: Medicare Other | Source: Ambulatory Visit | Attending: Surgery | Admitting: Surgery

## 2018-01-28 DIAGNOSIS — K219 Gastro-esophageal reflux disease without esophagitis: Secondary | ICD-10-CM | POA: Diagnosis not present

## 2018-01-28 DIAGNOSIS — I10 Essential (primary) hypertension: Secondary | ICD-10-CM | POA: Diagnosis not present

## 2018-01-28 DIAGNOSIS — G43909 Migraine, unspecified, not intractable, without status migrainosus: Secondary | ICD-10-CM | POA: Diagnosis not present

## 2018-01-28 DIAGNOSIS — E7800 Pure hypercholesterolemia, unspecified: Secondary | ICD-10-CM | POA: Diagnosis not present

## 2018-01-28 DIAGNOSIS — M1990 Unspecified osteoarthritis, unspecified site: Secondary | ICD-10-CM | POA: Diagnosis not present

## 2018-01-28 DIAGNOSIS — E119 Type 2 diabetes mellitus without complications: Secondary | ICD-10-CM | POA: Diagnosis not present

## 2018-01-28 DIAGNOSIS — M654 Radial styloid tenosynovitis [de Quervain]: Secondary | ICD-10-CM | POA: Diagnosis present

## 2018-01-28 DIAGNOSIS — Z87891 Personal history of nicotine dependence: Secondary | ICD-10-CM | POA: Insufficient documentation

## 2018-01-28 HISTORY — DX: Other specified diseases of liver: K76.89

## 2018-01-28 HISTORY — DX: Unspecified osteoarthritis, unspecified site: M19.90

## 2018-01-28 HISTORY — DX: Nontoxic single thyroid nodule: E04.1

## 2018-01-28 HISTORY — DX: Traumatic subdural hemorrhage with loss of consciousness of unspecified duration, initial encounter: S06.5X9A

## 2018-01-28 HISTORY — DX: Presence of dental prosthetic device (complete) (partial): Z97.2

## 2018-01-28 HISTORY — PX: MINOR RELEASE DORSAL COMPARTMENT (DEQUERVAINS): SHX5980

## 2018-01-28 HISTORY — DX: Prediabetes: R73.03

## 2018-01-28 HISTORY — DX: Gastro-esophageal reflux disease without esophagitis: K21.9

## 2018-01-28 SURGERY — MINOR RELEASE DORSAL COMPARTMENT (DEQUERVAINS)
Anesthesia: Regional | Site: Wrist | Laterality: Right

## 2018-01-28 MED ORDER — TRAMADOL HCL 50 MG PO TABS: 50.0000 mg | ORAL_TABLET | Freq: Four times a day (QID) | ORAL | 0 refills | Status: AC | PRN

## 2018-01-28 MED ORDER — LACTATED RINGERS IV SOLN
1000.0000 mL | INTRAVENOUS | Status: DC
  Administered 2018-01-28: 1000 mL via INTRAVENOUS

## 2018-01-28 MED ORDER — POTASSIUM CHLORIDE IN NACL 20-0.9 MEQ/L-% IV SOLN: INTRAVENOUS | Status: DC

## 2018-01-28 MED ORDER — OXYCODONE HCL 5 MG PO TABS: 5.0000 mg | ORAL_TABLET | ORAL | Status: DC | PRN

## 2018-01-28 MED ORDER — CEFAZOLIN SODIUM-DEXTROSE 2-4 GM/100ML-% IV SOLN
2.0000 g | Freq: Once | INTRAVENOUS | Status: AC
  Administered 2018-01-28: 2 g via INTRAVENOUS

## 2018-01-28 MED ORDER — METOCLOPRAMIDE HCL 5 MG/ML IJ SOLN: 5.0000 mg | Freq: Three times a day (TID) | INTRAMUSCULAR | Status: DC | PRN

## 2018-01-28 MED ORDER — LIDOCAINE HCL (CARDIAC) PF 100 MG/5ML IV SOSY
PREFILLED_SYRINGE | INTRAVENOUS | Status: DC | PRN
  Administered 2018-01-28: 40 mg via INTRAVENOUS

## 2018-01-28 MED ORDER — BUPIVACAINE HCL (PF) 0.5 % IJ SOLN
INTRAMUSCULAR | Status: DC | PRN
  Administered 2018-01-28: 5 mL

## 2018-01-28 MED ORDER — PROPOFOL 500 MG/50ML IV EMUL
INTRAVENOUS | Status: DC | PRN
  Administered 2018-01-28: 50 ug/kg/min via INTRAVENOUS

## 2018-01-28 MED ORDER — METOCLOPRAMIDE HCL 5 MG PO TABS: 5.0000 mg | ORAL_TABLET | Freq: Three times a day (TID) | ORAL | Status: DC | PRN

## 2018-01-28 MED ORDER — FENTANYL CITRATE (PF) 100 MCG/2ML IJ SOLN
INTRAMUSCULAR | Status: DC | PRN
  Administered 2018-01-28: 50 ug via INTRAVENOUS

## 2018-01-28 MED ORDER — ONDANSETRON HCL 4 MG PO TABS: 4.0000 mg | ORAL_TABLET | Freq: Four times a day (QID) | ORAL | Status: DC | PRN

## 2018-01-28 MED ORDER — ONDANSETRON HCL 4 MG/2ML IJ SOLN: 4.0000 mg | Freq: Four times a day (QID) | INTRAMUSCULAR | Status: DC | PRN

## 2018-01-28 MED ORDER — MIDAZOLAM HCL 5 MG/5ML IJ SOLN
INTRAMUSCULAR | Status: DC | PRN
  Administered 2018-01-28: 2 mg via INTRAVENOUS

## 2018-01-28 SURGICAL SUPPLY — 21 items
BANDAGE ELASTIC 2 LF NS (GAUZE/BANDAGES/DRESSINGS) ×3 IMPLANT
BNDG COHESIVE 4X5 TAN STRL (GAUZE/BANDAGES/DRESSINGS) ×3 IMPLANT
BNDG ESMARK 4X12 TAN STRL LF (GAUZE/BANDAGES/DRESSINGS) ×3 IMPLANT
CHLORAPREP W/TINT 26ML (MISCELLANEOUS) ×3 IMPLANT
CORD BIP STRL DISP 12FT (MISCELLANEOUS) ×3 IMPLANT
COVER LIGHT HANDLE UNIVERSAL (MISCELLANEOUS) ×6 IMPLANT
CUFF TOURNIQUET DUAL PORT 18X3 (MISCELLANEOUS) ×3 IMPLANT
DRAPE SURG 17X11 SM STRL (DRAPES) ×3 IMPLANT
GAUZE PETRO XEROFOAM 1X8 (MISCELLANEOUS) ×3 IMPLANT
GAUZE SPONGE 4X4 12PLY STRL (GAUZE/BANDAGES/DRESSINGS) ×3 IMPLANT
GLOVE BIO SURGEON STRL SZ8 (GLOVE) ×6 IMPLANT
GLOVE INDICATOR 8.0 STRL GRN (GLOVE) ×3 IMPLANT
GOWN STRL REUS W/ TWL LRG LVL3 (GOWN DISPOSABLE) ×1 IMPLANT
GOWN STRL REUS W/ TWL XL LVL3 (GOWN DISPOSABLE) ×1 IMPLANT
GOWN STRL REUS W/TWL LRG LVL3 (GOWN DISPOSABLE) ×2
GOWN STRL REUS W/TWL XL LVL3 (GOWN DISPOSABLE) ×2
KIT TURNOVER KIT A (KITS) ×3 IMPLANT
NS IRRIG 500ML POUR BTL (IV SOLUTION) ×3 IMPLANT
PACK EXTREMITY ARMC (MISCELLANEOUS) ×3 IMPLANT
STOCKINETTE IMPERVIOUS 9X36 MD (GAUZE/BANDAGES/DRESSINGS) ×3 IMPLANT
SUT PROLENE 4 0 PS 2 18 (SUTURE) ×3 IMPLANT

## 2018-01-28 NOTE — H&P (Signed)
Paper H&P to be scanned into permanent record. H&P reviewed and patient re-examined. No changes. 

## 2018-01-28 NOTE — Transfer of Care (Signed)
Immediate Anesthesia Transfer of Care Note  Patient: Allison Zhang  Procedure(s) Performed: MINOR RELEASE DORSAL COMPARTMENT (DEQUERVAINS) AND POSSIBLE GANGLION CYST EXCISION FOR DE QUERVAIN'S TENOSYNOVITIS (Right Wrist)  Patient Location: PACU  Anesthesia Type: General, Bier Block  Level of Consciousness: awake, alert  and patient cooperative  Airway and Oxygen Therapy: Patient Spontanous Breathing and Patient connected to supplemental oxygen  Post-op Assessment: Post-op Vital signs reviewed, Patient's Cardiovascular Status Stable, Respiratory Function Stable, Patent Airway and No signs of Nausea or vomiting  Post-op Vital Signs: Reviewed and stable  Complications: No apparent anesthesia complications

## 2018-01-28 NOTE — Op Note (Signed)
01/28/2018  11:49 AM  Patient:   Allison Zhang  Pre-Op Diagnosis:   DeQuervain's tenosynovitis, right wrist.  Post-Op Diagnosis:   Same.  Procedure:   Release of first dorsal compartment, right wrist.  Surgeon:   Maryagnes Amos, MD  Assistant:   None  Anesthesia:   Bier block  Findings:   As above.  Complications:   None  EBL:   0 cc  Fluids:   800 cc crystalloid  TT:   30 minutes at 250 mmHg  Drains:   None  Closure:   4-0 Prolene interrupted sutures  Brief Clinical Note:   The patient is a 77 year old female with a history of radial sided right wrist pain with a small mass in this area, consistent with a possible ganglion cyst. Her symptoms have persisted despite medications, activity modification, injection, etc. The patient's history and examination are consistent with DeQuervain's tenosynovitis. The patient presents at this time for release of the first dorsal compartment.  Procedure:   The patient was brought into the operating room and lain in the supine position. After adequate IV sedation was achieved, a Bier block was placed by the anesthesiologist and the tourniquet inflated to 250 mmHg. The right hand and upper extremity were prepped with ChloraPrep solution before being draped sterilely. Preoperative antibiotics were administered. After performing a timeout to verify the appropriate surgical site, a 1.5-2 cm incision was made transversely over the first dorsal compartment. The incision was carried down through subcutaneous tissues with care taken to identify and protect the sensory nerves and veins running in this area. The mass was identified and circumferentially dissected out before being removed. The underlying retinaculum was identified. The first dorsal compartment was released from proximal to distal using Metzenbaum scissors. The underlying tendons were carefully inspected and found to be intact. No additional adhesions were identified.  The wound was  copiously irrigated with sterile saline solution before the skin was reapproximated using 4-0 Prolene interrupted sutures. A total of 5 cc of 0.5% plain Sensorcaine was injected in and around the incision to help with postoperative analgesia before a sterile bulky dressing was applied to the wrist. The patient was then awakened and returned to the recovery room in satisfactory condition after tolerating the procedure well.

## 2018-01-28 NOTE — Anesthesia Postprocedure Evaluation (Signed)
Anesthesia Post Note  Patient: Allison Zhang  Procedure(s) Performed: MINOR RELEASE DORSAL COMPARTMENT (DEQUERVAINS) AND POSSIBLE GANGLION CYST EXCISION FOR DE QUERVAIN'S TENOSYNOVITIS (Right Wrist)  Patient location during evaluation: PACU Anesthesia Type: Bier Block Level of consciousness: awake Pain management: pain level controlled Vital Signs Assessment: post-procedure vital signs reviewed and stable Respiratory status: spontaneous breathing Cardiovascular status: blood pressure returned to baseline Postop Assessment: no headache Anesthetic complications: no    Beckey Downing

## 2018-01-28 NOTE — Anesthesia Procedure Notes (Signed)
Anesthesia Regional Block: Bier block (IV Regional)   Pre-Anesthetic Checklist: ,, timeout performed, Correct Patient, Correct Site, Correct Laterality, Correct Procedure, Correct Position, site marked, Risks and benefits discussed,  Surgical consent,  Pre-op evaluation,  At surgeon's request and post-op pain management  Laterality: Right      Procedures:,,,,, intact distal pulses, Esmarch exsanguination,, #20gu IV placed and double tourniquet utilized  Narrative:  Start time: 01/28/2018 11:14 AM End time: 01/28/2018 11:16 AM Injection made incrementally with aspirations every 5 mL.  Performed by: With CRNAs  Anesthesiologist: Beckey Downing, MD CRNA: Jimmy Picket, CRNA  Additional Notes: Esmarch exsanguination of R arm. Proximal and distal cuffs inflated. Distal cuff deflated. 35mL 0.5% Plain Lidocaine injected through R hand IV. Tolerated well by pt.

## 2018-01-28 NOTE — Discharge Instructions (Signed)
General Anesthesia, Adult, Care After These instructions provide you with information about caring for yourself after your procedure. Your health care provider may also give you more specific instructions. Your treatment has been planned according to current medical practices, but problems sometimes occur. Call your health care provider if you have any problems or questions after your procedure. What can I expect after the procedure? After the procedure, it is common to have:  Vomiting.  A sore throat.  Mental slowness.  It is common to feel:  Nauseous.  Cold or shivery.  Sleepy.  Tired.  Sore or achy, even in parts of your body where you did not have surgery.  Follow these instructions at home: For at least 24 hours after the procedure:  Do not: ? Participate in activities where you could fall or become injured. ? Drive. ? Use heavy machinery. ? Drink alcohol. ? Take sleeping pills or medicines that cause drowsiness. ? Make important decisions or sign legal documents. ? Take care of children on your own.  Rest. Eating and drinking  If you vomit, drink water, juice, or soup when you can drink without vomiting.  Drink enough fluid to keep your urine clear or pale yellow.  Make sure you have little or no nausea before eating solid foods.  Follow the diet recommended by your health care provider. General instructions  Have a responsible adult stay with you until you are awake and alert.  Return to your normal activities as told by your health care provider. Ask your health care provider what activities are safe for you.  Take over-the-counter and prescription medicines only as told by your health care provider.  If you smoke, do not smoke without supervision.  Keep all follow-up visits as told by your health care provider. This is important. Contact a health care provider if:  You continue to have nausea or vomiting at home, and medicines are not helpful.  You  cannot drink fluids or start eating again.  You cannot urinate after 8-12 hours.  You develop a skin rash.  You have fever.  You have increasing redness at the site of your procedure. Get help right away if:  You have difficulty breathing.  You have chest pain.  You have unexpected bleeding.  You feel that you are having a life-threatening or urgent problem. This information is not intended to replace advice given to you by your health care provider. Make sure you discuss any questions you have with your health care provider. Document Released: 07/01/2000 Document Revised: 08/28/2015 Document Reviewed: 03/09/2015 Elsevier Interactive Patient Education  2018 [employer].   Orthopedic discharge instructions: Keep dressing dry and intact. Keep hand elevated above heart level. May shower after dressing removed on postop day 4 (Sunday). Cover sutures with Band-Aids after drying off. Apply ice to affected area frequently. Take meloxicam daily with food for 7-10 days, then as necessary. Take ES Tylenol or pain medication as prescribed when needed.  Return for follow-up in 10-14 days or as scheduled.

## 2018-01-28 NOTE — Anesthesia Preprocedure Evaluation (Addendum)
Anesthesia Evaluation  Patient identified by MRN, date of birth, ID band Patient awake    Reviewed: Allergy & Precautions, NPO status , Patient's Chart, lab work & pertinent test results, reviewed documented beta blocker date and time   Airway Mallampati: II  TM Distance: >3 FB Neck ROM: Full    Dental  (+) Upper Dentures   Pulmonary former smoker,    Pulmonary exam normal breath sounds clear to auscultation       Cardiovascular hypertension, Normal cardiovascular exam Rhythm:Regular Rate:Normal     Neuro/Psych Anxiety Depression  Neuromuscular disease (h/o subdural hematoma, years ago, traumatic)    GI/Hepatic Neg liver ROS, GERD  ,  Endo/Other  diabetes  Renal/GU negative Renal ROS     Musculoskeletal  (+) Arthritis ,   Abdominal Normal abdominal exam  (+)   Peds  Hematology negative hematology ROS (+)   Anesthesia Other Findings   Reproductive/Obstetrics                            Anesthesia Physical Anesthesia Plan  ASA: II  Anesthesia Plan: General and Bier Block and Bier Block-LIDOCAINE ONLY   Post-op Pain Management:    Induction: Intravenous  PONV Risk Score and Plan:   Airway Management Planned: Natural Airway  Additional Equipment: None  Intra-op Plan:   Post-operative Plan:   Informed Consent: I have reviewed the patients History and Physical, chart, labs and discussed the procedure including the risks, benefits and alternatives for the proposed anesthesia with the patient or authorized representative who has indicated his/her understanding and acceptance.     Plan Discussed with: Anesthesiologist, CRNA and Surgeon  Anesthesia Plan Comments:        Anesthesia Quick Evaluation

## 2018-01-28 NOTE — Anesthesia Procedure Notes (Signed)
Performed by: Raeana Blinn, CRNA Pre-anesthesia Checklist: Patient identified, Emergency Drugs available, Suction available, Timeout performed and Patient being monitored Patient Re-evaluated:Patient Re-evaluated prior to induction Oxygen Delivery Method: Simple face mask Placement Confirmation: positive ETCO2       

## 2018-10-08 ENCOUNTER — Ambulatory Visit: Payer: Self-pay | Admitting: Licensed Clinical Social Worker

## 2018-10-12 ENCOUNTER — Other Ambulatory Visit: Payer: Self-pay | Admitting: Internal Medicine

## 2018-10-12 DIAGNOSIS — Z1231 Encounter for screening mammogram for malignant neoplasm of breast: Secondary | ICD-10-CM

## 2018-11-08 IMAGING — CT CT HEAD W/O CM
3 series · 15 of 47 positions shown, 18 images · non-contrast
Comparison: None.

ADDENDUM:
Right frontal and parietal burr holes
CLINICAL DATA: Syncopal episode

EXAM:
CT HEAD WITHOUT CONTRAST
TECHNIQUE: Contiguous axial images were obtained from the base of the skull
through the vertex without intravenous contrast.

[Series 2: head wo · axial · 0.47mm/px · z∈[-108,+17]mm · 9 of 30 slices shown, 12 images]
[im 3/30  brain]
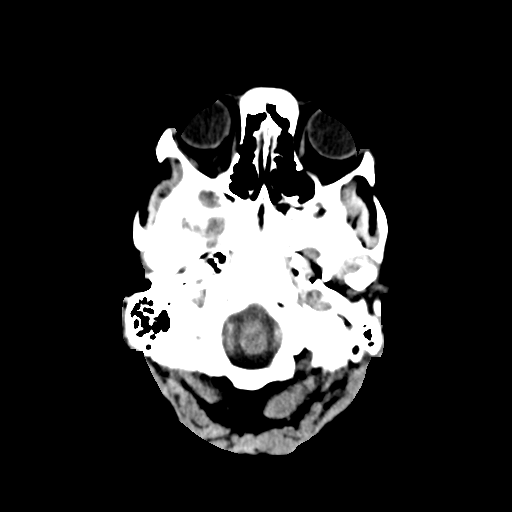
[im 3/30  bone]
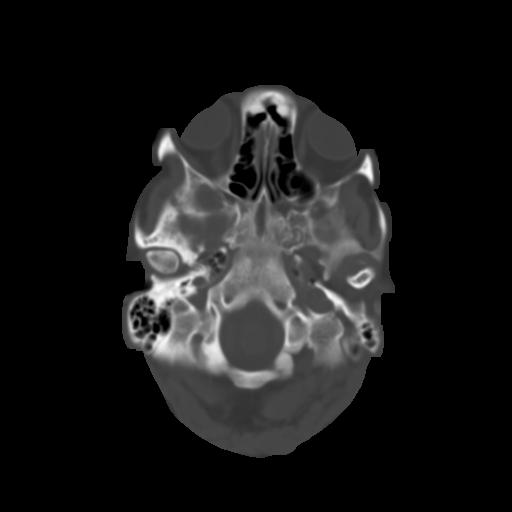
[im 6/30  brain]
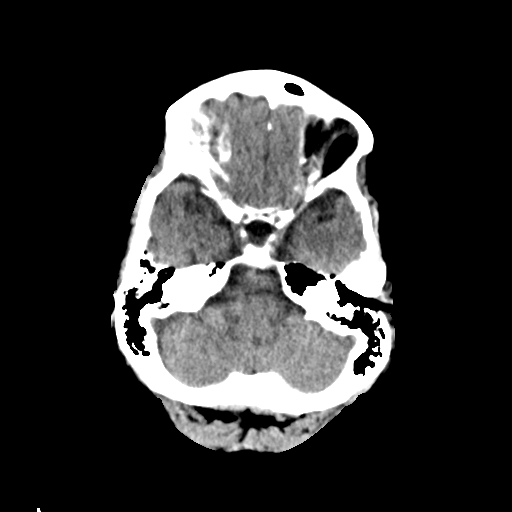
[im 9/30  brain]
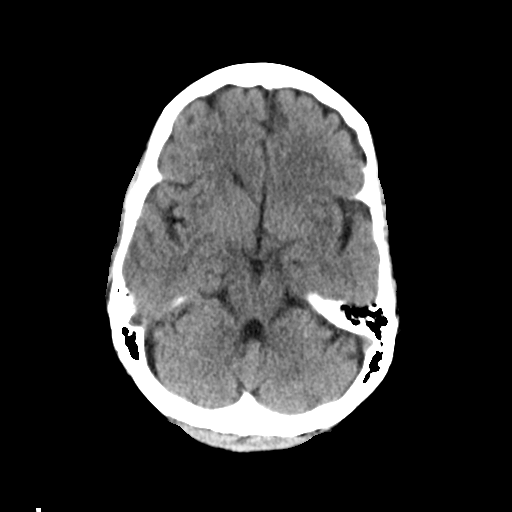
[im 12/30  brain]
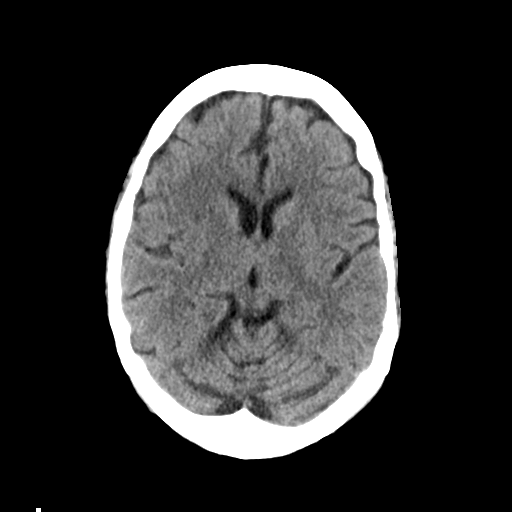
[im 16/30  brain]
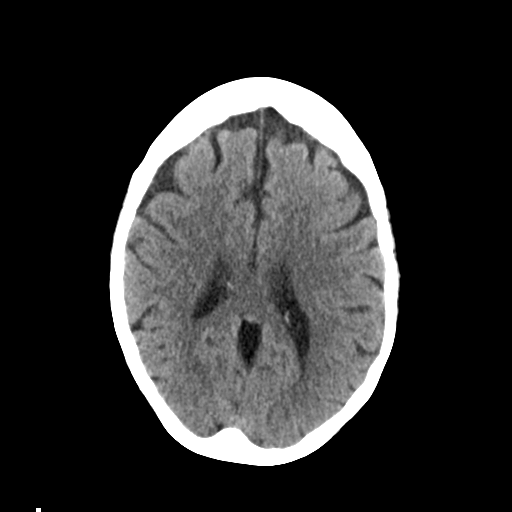
[im 16/30  bone]
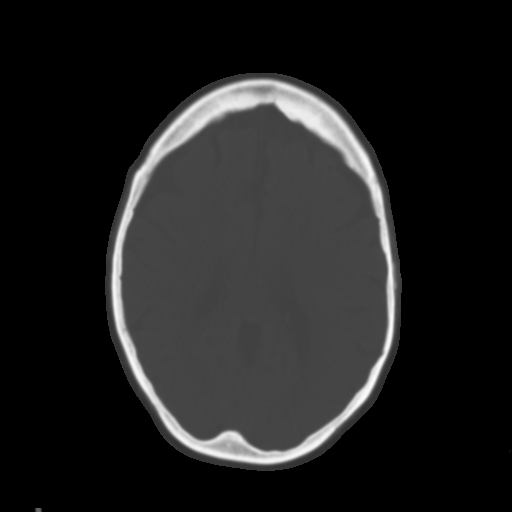
[im 19/30  brain]
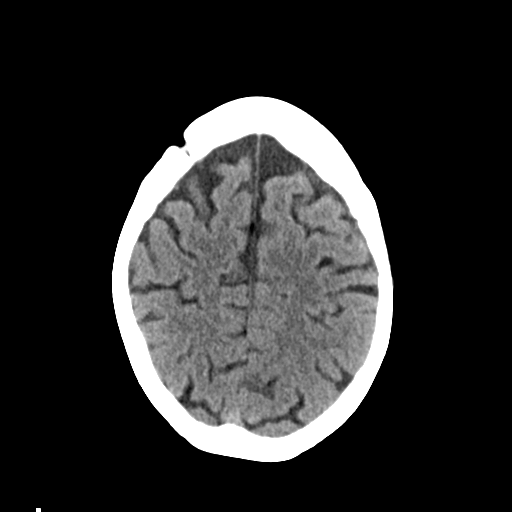
[im 22/30  brain]
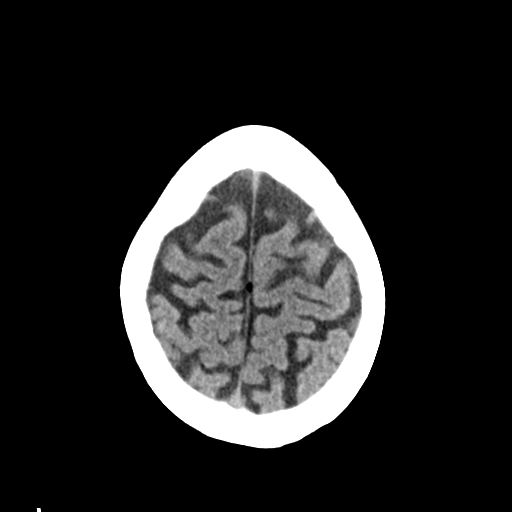
[im 25/30  brain]
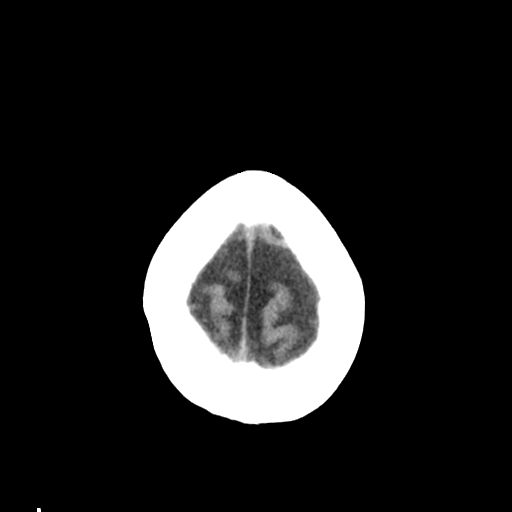
[im 28/30  brain]
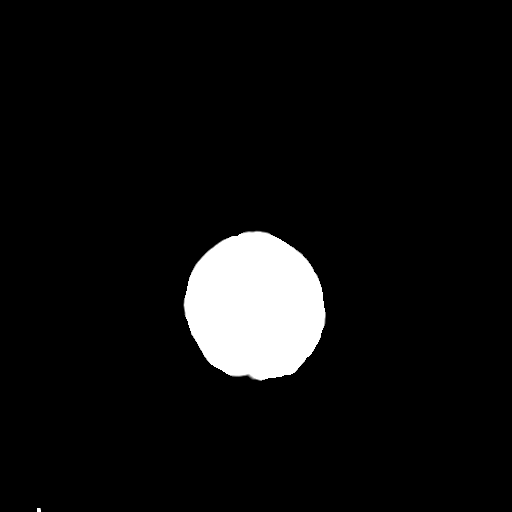
[im 28/30  bone]
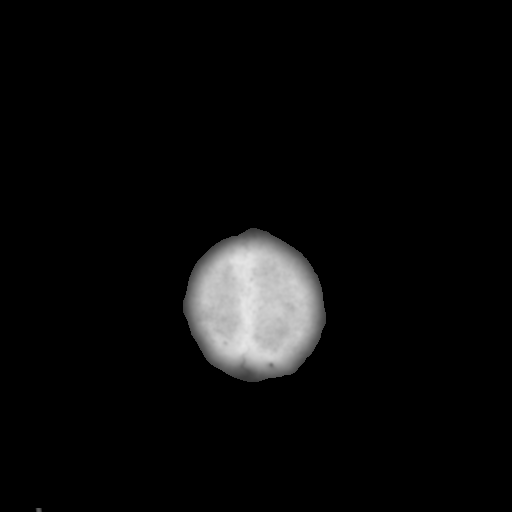

[Series 4: coronal soft tissue · coronal · 0.30mm/px · 3 of 61 slices shown]
[im 21/61  brain]
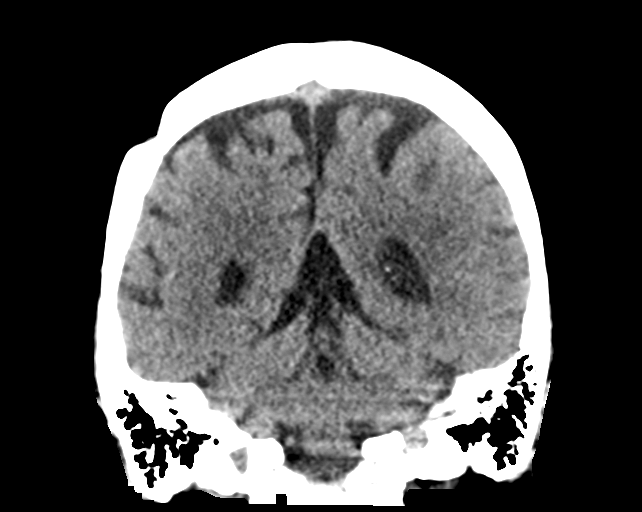
[im 27/61  brain]
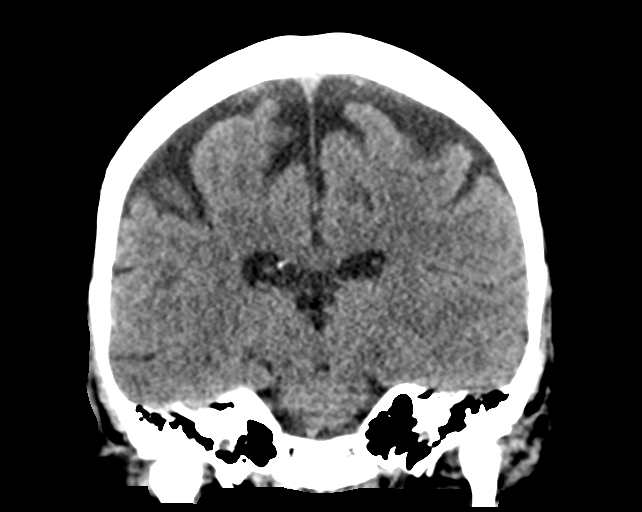
[im 34/61  brain]
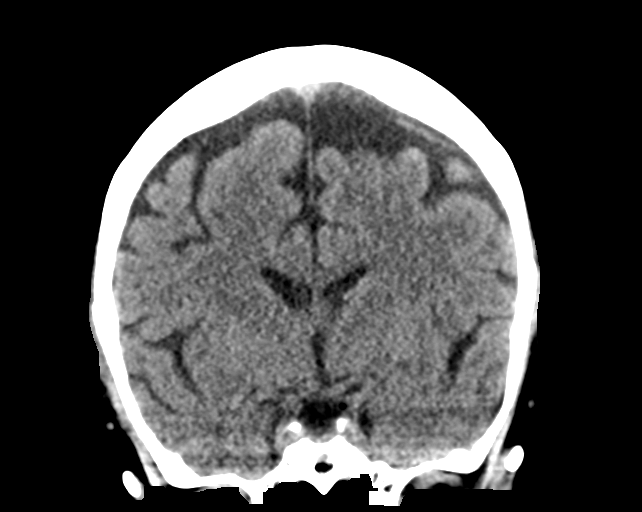

[Series 5: sagittal soft tissue · sagittal · 0.29mm/px · 3 of 47 slices shown]
[im 16/47  brain]
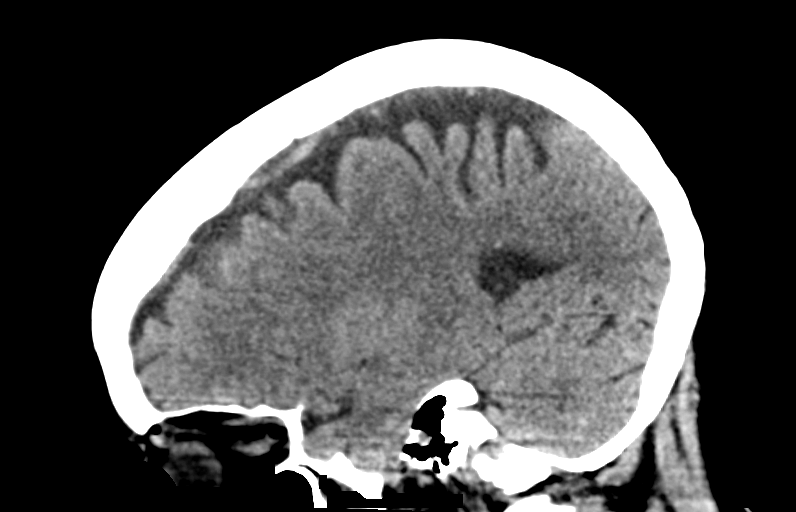
[im 24/47  brain]
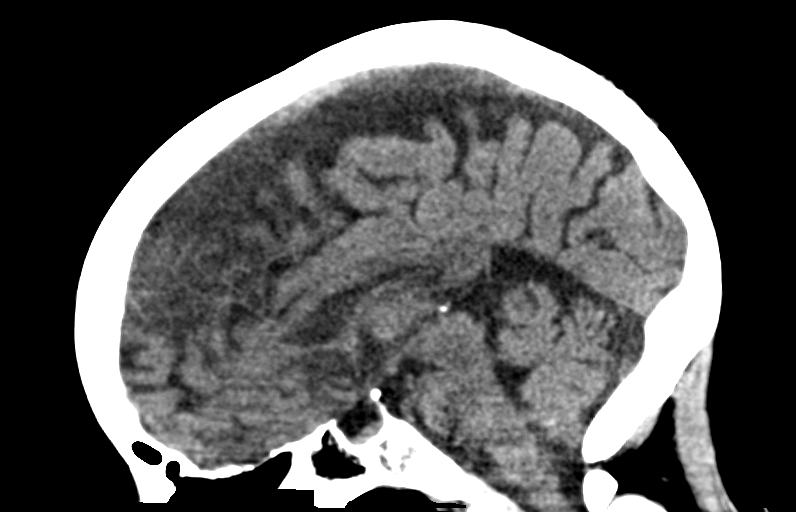
[im 31/47  brain]
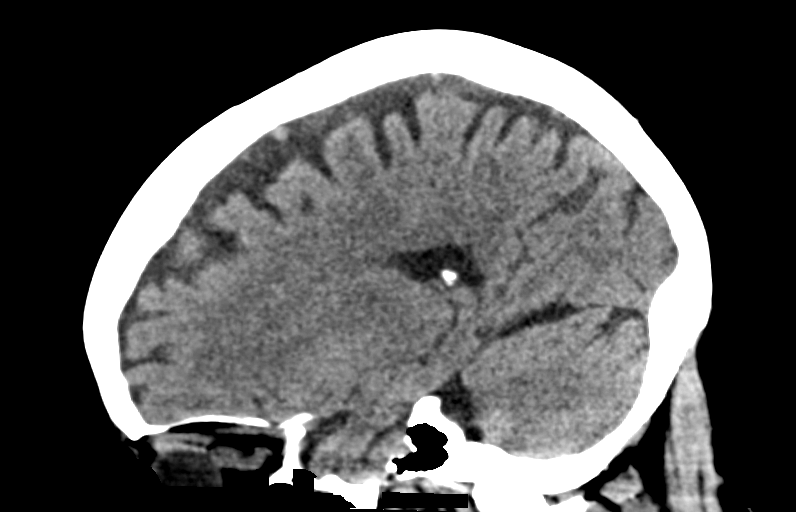

[15 of 47 positions shown; findings below may reference images not displayed]

FINDINGS: Brain: No acute territorial infarction, hemorrhage or intracranial
mass is visualized. Mild to moderate atrophy. Normal ventricle size.

Vascular: No hyperdense vessels.  Carotid artery calcification.

Skull: Normal. Negative for fracture or focal lesion.

Sinuses/Orbits: No acute finding.

Other: None
IMPRESSION: No CT evidence for acute intracranial abnormality.

## 2018-11-08 IMAGING — CT CT ANGIO CHEST
1 of 6 series · 18 of 36 positions shown · IV contrast (APPLIED)
Comparison: None

CLINICAL DATA: Syncope x4. History of hypertension, hyperlipidemia.

EXAM:
CT ANGIOGRAPHY CHEST WITH CONTRAST
TECHNIQUE: Multidetector CT imaging of the chest was performed using the
standard protocol during bolus administration of intravenous
contrast. Multiplanar CT image reconstructions and MIPs were
obtained to evaluate the vascular anatomy.
CONTRAST:  75 cc Isovue 370

[Series 5: thins · axial · 0.67mm/px · z∈[-260,-39]mm · 18 of 247 slices shown]
[im 13/247  lung]
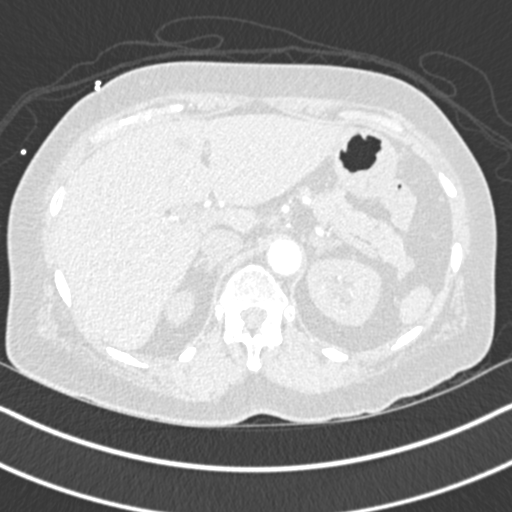
[im 25/247  mediastinal]
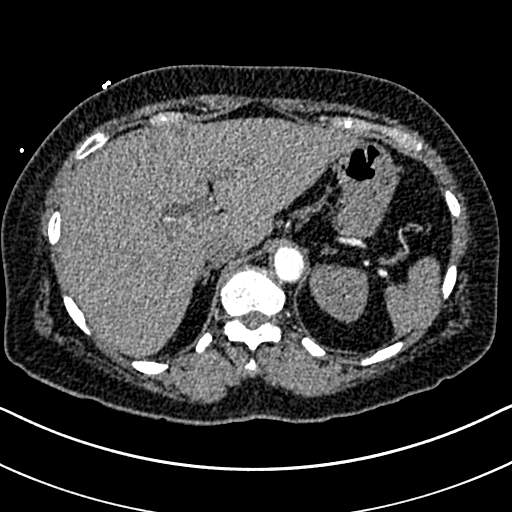
[im 37/247  lung]
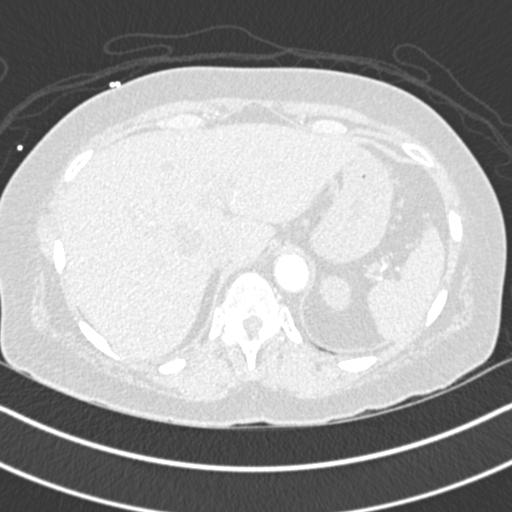
[im 50/247  mediastinal]
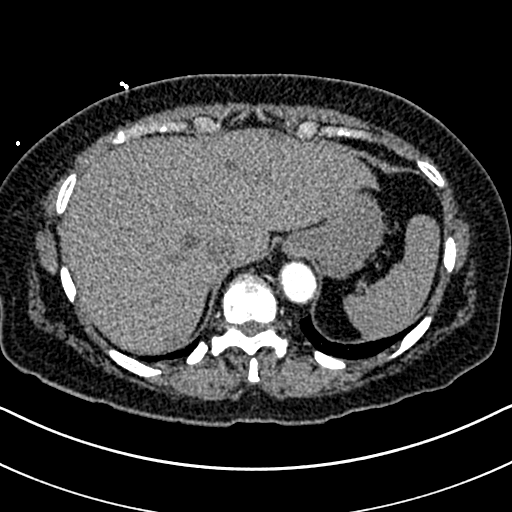
[im 62/247  lung]
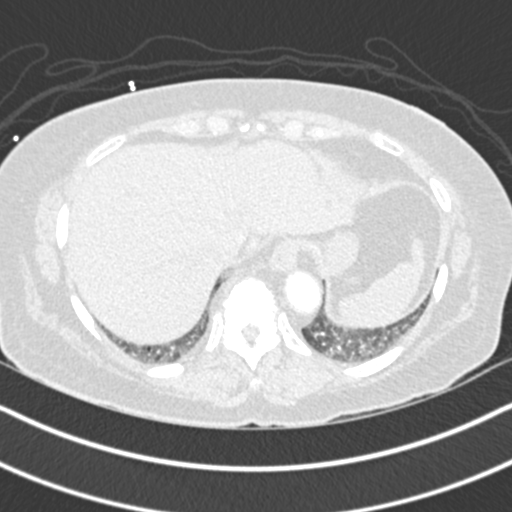
[im 74/247  mediastinal]
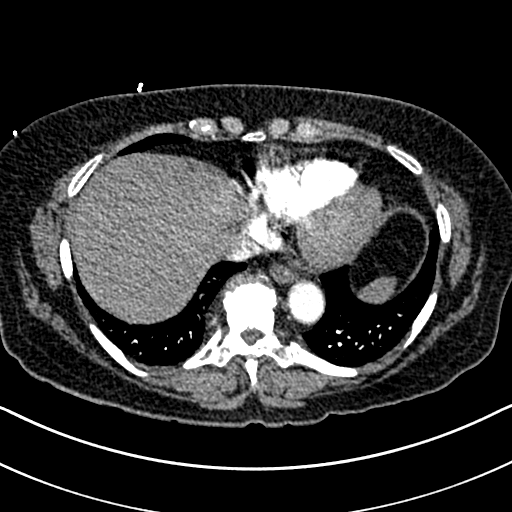
[im 87/247  lung]
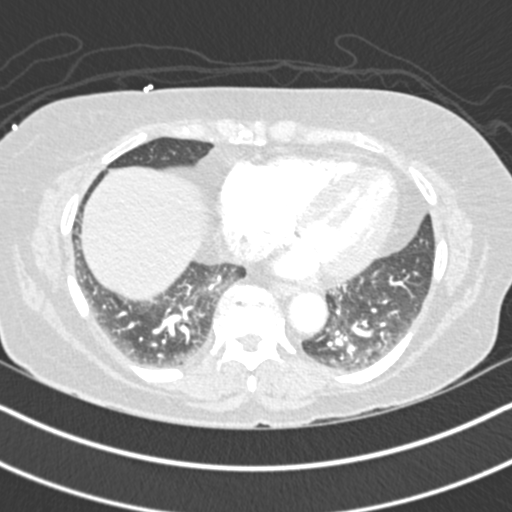
[im 99/247  mediastinal]
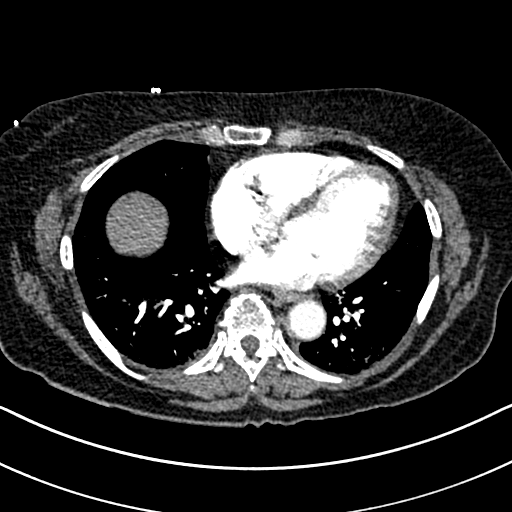
[im 111/247  lung]
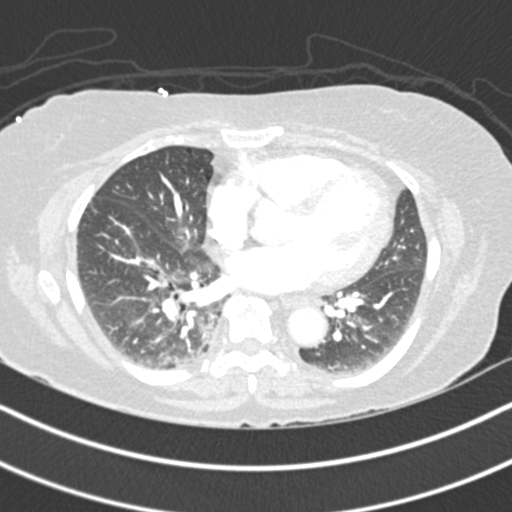
[im 136/247  mediastinal]
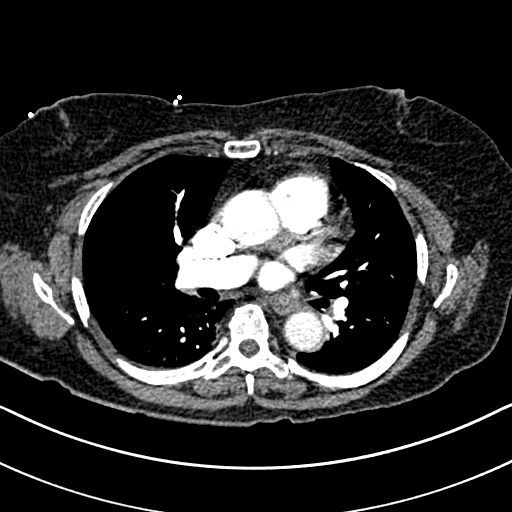
[im 148/247  lung]
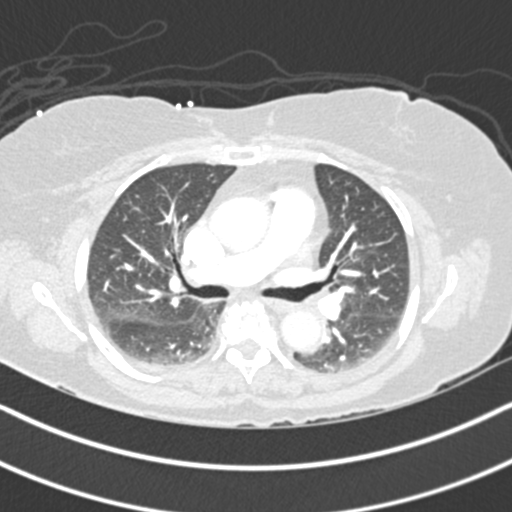
[im 160/247  mediastinal]
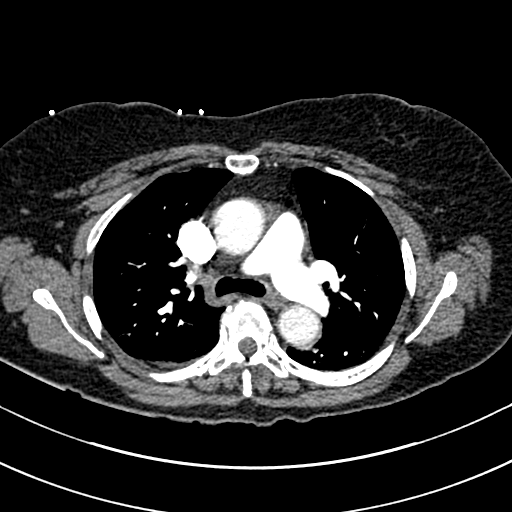
[im 173/247  lung]
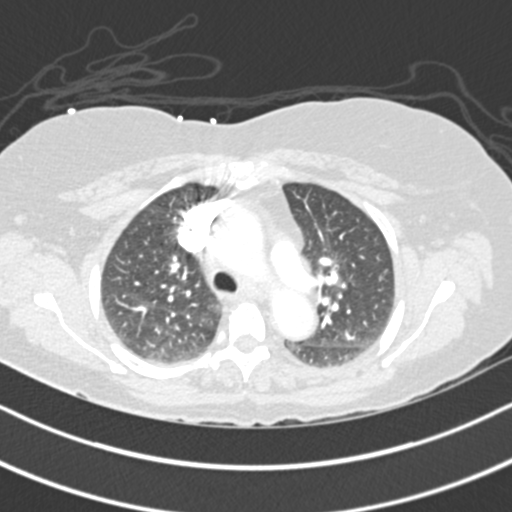
[im 185/247  mediastinal]
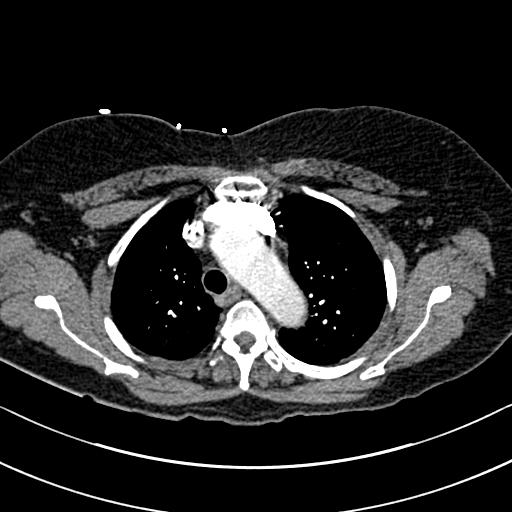
[im 197/247  lung]
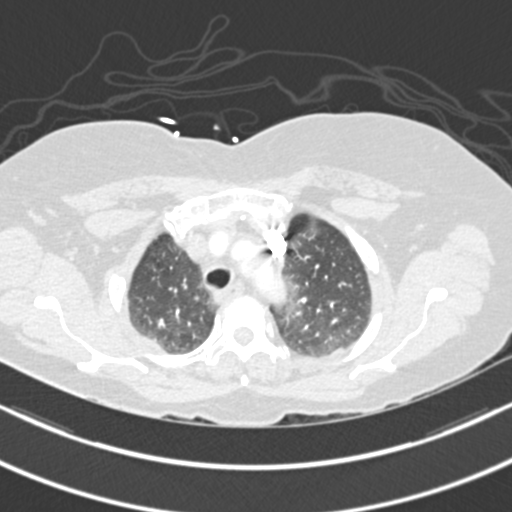
[im 210/247  mediastinal]
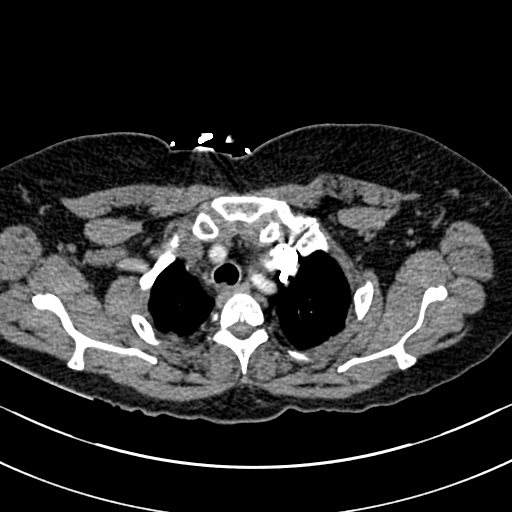
[im 222/247  lung]
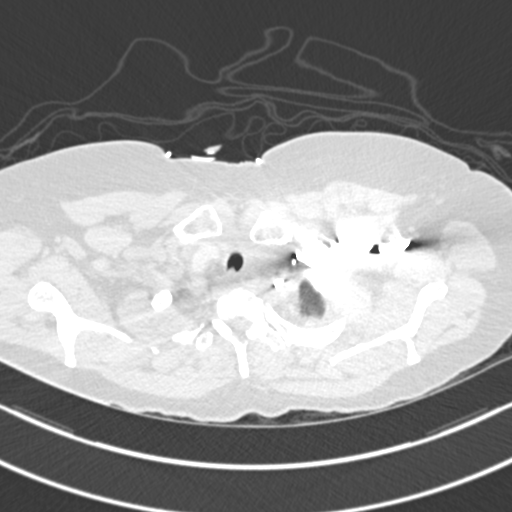
[im 234/247  mediastinal]
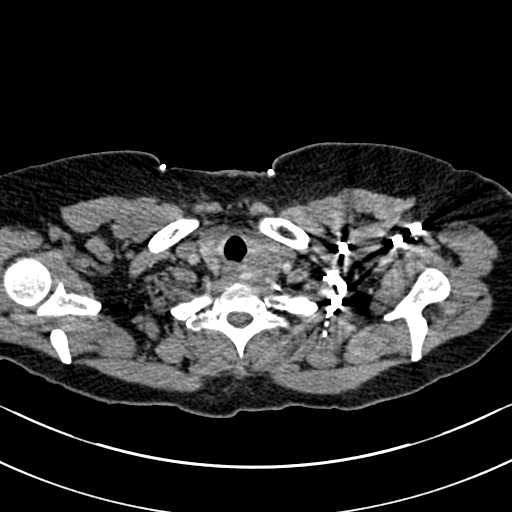

[18 of 36 positions shown; findings below may reference images not displayed]

FINDINGS: Cardiovascular: Pulmonary arteries are well opacified. There is no
acute pulmonary embolus. Heart size is normal. No significant
coronary artery calcifications. No pericardial effusion. No
significant atherosclerotic calcification of the thoracic aorta. No
aneurysm.

Mediastinum/Nodes: Asymmetry of the thyroid gland, left lobe larger
than the right globe. Suspect a 2.2 cm low-attenuation lesion within
the left lobe.

Lungs/Pleura: Bilateral dependent atelectasis. No focal
consolidations. No pleural effusions.

Upper Abdomen: There are numerous low-attenuation lesions throughout
the liver, not fully characterized given the phase of contrast. Many
of these are likely cysts given the appearance of circumscribed
margins. However multiple lesions have poorly defined margins and
remain indeterminate. Patient has had prior cholecystectomy. A
cm left adrenal nodule is not further characterized.

Musculoskeletal: There are moderate degenerative changes in the
midthoracic spine. No suspicious lytic or blastic lesions.

Review of the MIP images confirms the above findings.
IMPRESSION: 1. Technically adequate exam showing no acute pulmonary embolus.
2. Suspect 2.2 cm left thyroid nodule. Recommend further evaluation
with thyroid ultrasound per consensus criteria.
3. Numerous low-attenuation lesions throughout the liver, many of
which are likely benign. However some of the lesions appear somewhat
indistinct and further characterization is warranted. Recommend
follow-up MRI. MRI should be performed when the patient is
clinically stable and able to follow breath holding instructions
(usually best performed on an outpatient basis).
4. Previous cholecystectomy.
5. Left adrenal nodule is 1.5 cm. Nodule can be evaluated at the
time of follow-up MRI.

## 2018-11-17 ENCOUNTER — Ambulatory Visit
Admission: RE | Admit: 2018-11-17 | Discharge: 2018-11-17 | Disposition: A | Discharge status: Home / Self Care | Payer: Medicare Other | Source: Ambulatory Visit | Attending: Internal Medicine | Admitting: Internal Medicine

## 2018-11-17 DIAGNOSIS — Z1231 Encounter for screening mammogram for malignant neoplasm of breast: Secondary | ICD-10-CM | POA: Insufficient documentation

## 2019-04-17 ENCOUNTER — Other Ambulatory Visit: Payer: Self-pay

## 2019-04-17 ENCOUNTER — Encounter: Payer: Self-pay | Admitting: Emergency Medicine

## 2019-04-17 ENCOUNTER — Emergency Department
Admission: EM | Admit: 2019-04-17 | Discharge: 2019-04-17 | Disposition: A | Discharge status: Home / Self Care | Payer: Medicare Other | Attending: Student | Admitting: Student

## 2019-04-17 DIAGNOSIS — M79602 Pain in left arm: Secondary | ICD-10-CM | POA: Diagnosis present

## 2019-04-17 DIAGNOSIS — I10 Essential (primary) hypertension: Secondary | ICD-10-CM | POA: Diagnosis not present

## 2019-04-17 DIAGNOSIS — G500 Trigeminal neuralgia: Secondary | ICD-10-CM | POA: Diagnosis not present

## 2019-04-17 DIAGNOSIS — Z79899 Other long term (current) drug therapy: Secondary | ICD-10-CM | POA: Insufficient documentation

## 2019-04-17 LAB — TROPONIN I (HIGH SENSITIVITY): Troponin I (High Sensitivity): 4 ng/L (ref <18)

## 2019-04-17 MED ORDER — NAPROXEN 500 MG PO TABS: 500.0000 mg | ORAL_TABLET | Freq: Two times a day (BID) | ORAL | 2 refills | Status: AC

## 2019-04-17 NOTE — ED Triage Notes (Signed)
L upper arm pain x 2 weeks. Increases with palpation. Does have history of R shoulder pain from rotator cuff problems which she has had some relief with injections.

## 2019-04-17 NOTE — ED Provider Notes (Signed)
[facility] EMERGENCY DEPARTMENT Provider Note   CSN: [id number] Arrival date & time: 04/17/19  1626     History Chief Complaint  Patient presents with  . Arm Pain    Allison Zhang is a 79 y.o. female presents to the emergency department for evaluation of left arm pain.  Pain is located along the lateral aspect of the elbow and radial aspect of the forearm.  She describes it constant deep pain.  She has a hard time describing the pain but states there is no associated numbness tingling burning.  Take is deep and constant.  Pain is not sharp.  She has had the pain for a week and a half and has had some improvement with Aleve.  She is had a history of rotator cuff syndrome several months ago received a shoulder injection which resolved this issue and she does not feel as if this pain is coming from her shoulder.  She has no reproducible pain to touch nor reproducible pain in the left arm with neck or shoulder range of motion.  She denies any cardiac disease, chest pain, shortness of breath, headache, vision changes, slurred speech, diaphoresis, nausea, vomiting.  Overall she feels well except for the 3 out of 10 pain in her left arm.  HPI     Past Medical History:  Diagnosis Date  . Anxiety   . Arthritis    right hip  . Depression   . GERD (gastroesophageal reflux disease)   . Hyperlipidemia   . Hypertension   . Liver nodule   . Pre-diabetes   . Subdural hematoma [facility]) 2004   [place], [place]  . Thyroid nodule   . Trigeminal neuralgia   . Wears dentures    full upper    Patient Active Problem List   Diagnosis Date Noted  . Syncope 01/09/2017  . HTN (hypertension) 01/09/2017  . HLD (hyperlipidemia) 01/09/2017  . Trigeminal neuralgia 01/09/2017    Past Surgical History:  Procedure Laterality Date  . CHOLECYSTECTOMY    . MINOR RELEASE DORSAL COMPARTMENT (DEQUERVAINS) Right 01/28/2018   Procedure: MINOR RELEASE DORSAL COMPARTMENT (DEQUERVAINS) AND  POSSIBLE GANGLION CYST EXCISION FOR DE QUERVAIN'S TENOSYNOVITIS;  Surgeon: Christena Flake, MD;  Location: [facility];  Service: Orthopedics;  Laterality: Right;     OB History   No obstetric history on file.     Family History  Problem Relation Age of Onset  . Hypertension Mother   . Hypertension Father   . Hypertension Brother   . Clotting disorder Brother   . Cancer Brother   . Breast cancer Neg Hx     Social History   Tobacco Use  . Smoking status: Former Smoker    Types: Cigarettes    Quit date: 1985    Years since quitting: 36.0  . Smokeless tobacco: Never Used  Substance Use Topics  . Alcohol use: Yes    Alcohol/week: 2.0 standard drinks    Types: 2 Glasses of wine per week    Comment: occ  . Drug use: No    Home Medications Prior to Admission medications   Medication Sig Start Date End Date Taking? Authorizing Provider  amLODipine (NORVASC) 5 MG tablet Take 5 mg by mouth daily.    [provider]  atorvastatin (LIPITOR) 20 MG tablet Take 20 mg by mouth at bedtime. 11/04/16   [provider]  Biotin (SUPER BIOTIN) 5 MG TABS Take 5 mg by mouth 2 (two) times daily.  [provider]  buPROPion (WELLBUTRIN XL) 300 MG 24 hr tablet Take 300 mg by mouth daily. 12/06/16   [provider]  carbamazepine (TEGRETOL XR) 200 MG 12 hr tablet Take 200 mg by mouth 2 (two) times daily. 01/09/17   [provider]  Cholecalciferol (VITAMIN D3) 2000 units capsule Take 2,000 Units by mouth daily.    [provider]  CINNAMON PO Take by mouth daily.    [provider]  clobetasol cream (TEMOVATE) 0.05 % Apply 1 application topically 2 (two) times daily as needed.    [provider]  clonazePAM (KLONOPIN) 0.5 MG tablet Take 0.25 mg by mouth 2 (two) times daily. 11/23/16   [provider]  cyclobenzaprine (FLEXERIL) 5 MG tablet Take 5 mg by mouth 3 (three) times daily as needed for muscle spasms.     [provider]  diphenhydrAMINE (BENADRYL) 25 MG tablet Take 25 mg by mouth at bedtime as needed.    [provider]  eszopiclone (LUNESTA) 2 MG TABS tablet Take 2 mg by mouth at bedtime. 11/19/16   [provider]  fluocinonide (LIDEX) 0.05 % external solution Apply 1 application topically 2 (two) times daily as needed.    [provider]  gabapentin (NEURONTIN) 600 MG tablet Take 300 mg by mouth 2 (two) times daily.  11/04/16   [provider]  ketoconazole (NIZORAL) 2 % shampoo Apply 1 application topically 2 (two) times a week.    [provider]  losartan-hydrochlorothiazide (HYZAAR) 100-12.5 MG tablet Take 1 tablet by mouth daily.    [provider]  meloxicam (MOBIC) 15 MG tablet Take 15 mg by mouth daily.    [provider]  naproxen (NAPROSYN) 500 MG tablet Take 1 tablet (500 mg total) by mouth 2 (two) times daily with a meal. 04/17/19 04/16/20  Evon Slack, PA-C  pantoprazole (PROTONIX) 40 MG tablet Take 40 mg by mouth daily as needed.    [provider]  traMADol (ULTRAM) 50 MG tablet Take 1 tablet (50 mg total) by mouth every 6 (six) hours as needed for moderate pain. 01/28/18   Poggi, Excell Seltzer, MD  venlafaxine XR (EFFEXOR-XR) 150 MG 24 hr capsule Take 150 mg by mouth daily. 12/27/16   [provider]    Allergies    Patient has no known allergies.  Review of Systems   Review of Systems  Constitutional: Negative for fever.  HENT: Negative for trouble swallowing.   Respiratory: Negative for choking, chest tightness and shortness of breath.   Cardiovascular: Negative for chest pain.  Gastrointestinal: Negative for nausea and vomiting.  Musculoskeletal: Positive for arthralgias and myalgias. Negative for back pain, gait problem, joint swelling and neck pain.  Skin: Negative for wound.  Neurological: Negative for weakness and headaches.    Physical Exam Updated Vital Signs BP (!) 179/74    Pulse 75   Temp 98.9 F (37.2 C) (Oral)   Resp 18   Ht 5\' 3"  (1.6 m)   Wt 69.4 kg   SpO2 100%   BMI 27.10 kg/m   Physical Exam Constitutional:      Appearance: She is well-developed.  HENT:     Head: Normocephalic and atraumatic.  Eyes:     Conjunctiva/sclera: Conjunctivae normal.  Cardiovascular:     Rate and Rhythm: Normal rate.  Pulmonary:     Effort: Pulmonary effort is normal. No respiratory distress.  Musculoskeletal:        General: Normal range of  motion.     Cervical back: Normal range of motion.     Comments: Cervical Spine: Examination of the cervical spine reveals no bony abnormality, no edema, and no ecchymosis.  There is no step-off.  The patient has full active and passive range of motion of the cervical spine with flexion, extension, and right and left bend with rotation.  There is no crepitus with range of motion exercises.  The patient is non-tender along the spinous process to palpation.  The patient has no paravertebral muscle spasm.  There is no parascapular discomfort.  The patient has a negative axial compression test.  The patient has a negative Spurling test.    Left upper Extremity: Examination of the left shoulder and arm showed no bony abnormality or edema.  The patient has normal active and passive motion with abduction, flexion, internal rotation, and external rotation.  The patient has no tenderness with motion.  The patient has a negative Hawkins test and a negative impingement test.  The patient has a negative drop arm test.  The patient is non-tender along the deltoid muscle.  There is no subacromial space tenderness with no AC joint tenderness.  The patient has no instability of the shoulder with anterior-posterior motion.  There is a negative sulcus sign.  The rotator cuff muscle strength is 5/5 with supraspinatus, 5/5 with internal rotation, and 5/5 with external rotation.  There is no crepitus with range of motion activities.5-5 strength with biceps  triceps and grip strength.  Nontender to palpation on the lateral epicondyle.   Skin:    General: Skin is warm.     Findings: No rash.  Neurological:     Mental Status: She is alert and oriented to person, place, and time.  Psychiatric:        Behavior: Behavior normal.        Thought Content: Thought content normal.     ED Results / Procedures / Treatments   Labs (all labs ordered are listed, but only abnormal results are displayed) Labs Reviewed  TROPONIN I (HIGH SENSITIVITY)    EKG None  Radiology No results found.  Procedures Procedures (including critical care time)  Medications Ordered in ED Medications - No data to display  ED Course  I have reviewed the triage vital signs and the nursing notes.  Pertinent labs & imaging results that were available during my care of the patient were reviewed by me and considered in my medical decision making (see chart for details).    MDM Rules/Calculators/A&P                       79 year old female with left arm pain, no tenderness to touch, no swelling or neurological deficits.  No signs of abscess or cellulitis.  EKG and troponin negative.  Based on history and exam pain most likely coming from a cervical nerve impingement.  She is placed on naproxen 500 mg twice daily for 7 days.  She will take with food.  If no improvement follow-up PCP.  She understands signs symptoms return to ED for. Final Clinical Impression(s) / ED Diagnoses Final diagnoses:  Left arm pain    Rx / DC Orders ED Discharge Orders         Ordered    naproxen (NAPROSYN) 500 MG tablet  2 times daily with meals     04/17/19 1906           Duanne Guess, [place] 04/17/19 1908  Miguel Aschoff., MD 04/17/19 2006

## 2019-04-17 NOTE — Discharge Instructions (Addendum)
Please take naproxen twice daily with food for 7 days.  You may take Tylenol for additional pain relief.  If no improvement in 1 week follow-up with primary care provider.  Return to the ER for any worsening symptoms or urgent changes in health.

## 2019-05-04 ENCOUNTER — Other Ambulatory Visit: Payer: Self-pay | Admitting: Internal Medicine

## 2019-05-04 DIAGNOSIS — E278 Other specified disorders of adrenal gland: Secondary | ICD-10-CM

## 2019-05-15 ENCOUNTER — Ambulatory Visit
Admission: RE | Admit: 2019-05-15 | Discharge: 2019-05-15 | Disposition: A | Discharge status: Home / Self Care | Payer: Medicare Other | Source: Ambulatory Visit | Attending: Internal Medicine | Admitting: Internal Medicine

## 2019-05-15 ENCOUNTER — Other Ambulatory Visit: Payer: Self-pay

## 2019-05-15 DIAGNOSIS — E278 Other specified disorders of adrenal gland: Secondary | ICD-10-CM | POA: Diagnosis present

## 2019-05-15 MED ORDER — GADOBUTROL 1 MMOL/ML IV SOLN
7.0000 mL | Freq: Once | INTRAVENOUS | Status: AC | PRN
  Administered 2019-05-15: 13:00:00 7 mL via INTRAVENOUS

## 2019-05-18 ENCOUNTER — Other Ambulatory Visit: Payer: Self-pay | Admitting: Internal Medicine

## 2019-05-18 DIAGNOSIS — N2889 Other specified disorders of kidney and ureter: Secondary | ICD-10-CM

## 2019-08-13 ENCOUNTER — Ambulatory Visit
Admission: RE | Admit: 2019-08-13 | Discharge: 2019-08-13 | Disposition: A | Discharge status: Home / Self Care | Payer: Medicare Other | Source: Ambulatory Visit | Attending: Internal Medicine | Admitting: Internal Medicine

## 2019-08-13 ENCOUNTER — Other Ambulatory Visit: Payer: Self-pay

## 2019-08-13 DIAGNOSIS — N2889 Other specified disorders of kidney and ureter: Secondary | ICD-10-CM | POA: Insufficient documentation

## 2019-08-13 MED ORDER — GADOBUTROL 1 MMOL/ML IV SOLN
7.0000 mL | Freq: Once | INTRAVENOUS | Status: AC | PRN
  Administered 2019-08-13: 11:00:00 7 mL via INTRAVENOUS

## 2020-01-03 ENCOUNTER — Other Ambulatory Visit: Payer: Self-pay | Admitting: Internal Medicine

## 2020-01-03 DIAGNOSIS — Z1231 Encounter for screening mammogram for malignant neoplasm of breast: Secondary | ICD-10-CM

## 2020-03-10 ENCOUNTER — Other Ambulatory Visit: Payer: Self-pay

## 2020-03-10 ENCOUNTER — Ambulatory Visit
Admission: RE | Admit: 2020-03-10 | Discharge: 2020-03-10 | Disposition: A | Discharge status: Home / Self Care | Payer: Medicare Other | Source: Ambulatory Visit | Attending: Internal Medicine | Admitting: Internal Medicine

## 2020-03-10 DIAGNOSIS — Z1231 Encounter for screening mammogram for malignant neoplasm of breast: Secondary | ICD-10-CM | POA: Insufficient documentation

## 2021-05-18 ENCOUNTER — Other Ambulatory Visit: Payer: Self-pay | Admitting: Internal Medicine

## 2021-05-18 DIAGNOSIS — Z1231 Encounter for screening mammogram for malignant neoplasm of breast: Secondary | ICD-10-CM

## 2021-06-26 ENCOUNTER — Other Ambulatory Visit: Payer: Self-pay

## 2021-06-26 ENCOUNTER — Ambulatory Visit
Admission: RE | Admit: 2021-06-26 | Discharge: 2021-06-26 | Disposition: A | Discharge status: Home / Self Care | Payer: Medicare Other | Source: Ambulatory Visit | Attending: Internal Medicine | Admitting: Internal Medicine

## 2021-06-26 DIAGNOSIS — Z1231 Encounter for screening mammogram for malignant neoplasm of breast: Secondary | ICD-10-CM | POA: Insufficient documentation

## 2021-12-03 ENCOUNTER — Other Ambulatory Visit: Payer: Self-pay | Admitting: Student

## 2021-12-03 DIAGNOSIS — G500 Trigeminal neuralgia: Secondary | ICD-10-CM

## 2021-12-03 DIAGNOSIS — H02409 Unspecified ptosis of unspecified eyelid: Secondary | ICD-10-CM

## 2021-12-12 ENCOUNTER — Ambulatory Visit
Admission: RE | Admit: 2021-12-12 | Discharge: 2021-12-12 | Disposition: A | Discharge status: Home / Self Care | Payer: Medicare Other | Source: Ambulatory Visit | Attending: Student | Admitting: Student

## 2021-12-12 DIAGNOSIS — G500 Trigeminal neuralgia: Secondary | ICD-10-CM | POA: Diagnosis present

## 2021-12-12 DIAGNOSIS — H02409 Unspecified ptosis of unspecified eyelid: Secondary | ICD-10-CM | POA: Diagnosis not present

## 2022-06-05 ENCOUNTER — Other Ambulatory Visit: Payer: Self-pay | Admitting: Internal Medicine

## 2022-06-05 DIAGNOSIS — Z1231 Encounter for screening mammogram for malignant neoplasm of breast: Secondary | ICD-10-CM

## 2022-06-28 ENCOUNTER — Ambulatory Visit
Admission: RE | Admit: 2022-06-28 | Discharge: 2022-06-28 | Disposition: A | Discharge status: Home / Self Care | Payer: Medicare Other | Source: Ambulatory Visit | Attending: Internal Medicine | Admitting: Internal Medicine

## 2022-06-28 DIAGNOSIS — Z1231 Encounter for screening mammogram for malignant neoplasm of breast: Secondary | ICD-10-CM | POA: Insufficient documentation

## 2023-06-16 ENCOUNTER — Other Ambulatory Visit: Payer: Self-pay | Admitting: Internal Medicine

## 2023-06-16 DIAGNOSIS — Z1231 Encounter for screening mammogram for malignant neoplasm of breast: Secondary | ICD-10-CM

## 2023-06-30 ENCOUNTER — Ambulatory Visit
Admission: RE | Admit: 2023-06-30 | Discharge: 2023-06-30 | Disposition: A | Discharge status: Home / Self Care | Source: Ambulatory Visit | Attending: Internal Medicine | Admitting: Internal Medicine

## 2023-06-30 DIAGNOSIS — Z1231 Encounter for screening mammogram for malignant neoplasm of breast: Secondary | ICD-10-CM | POA: Diagnosis present

## 2024-04-15 ENCOUNTER — Other Ambulatory Visit: Payer: Self-pay

## 2024-04-15 MED ORDER — FLUZONE HIGH-DOSE 0.5 ML IM SUSY
0.5000 mL | PREFILLED_SYRINGE | Freq: Once | INTRAMUSCULAR | 0 refills | Status: AC
  Filled 2024-04-15: qty 0.5, 1d supply, fill #0
